# Patient Record
Sex: Female | Born: 1940 | Race: White | Hispanic: No | Marital: Married | Smoking: Never smoker
Health system: Southern US, Community
[De-identification: ages and names within clinical notes are randomized; demographics above are authoritative.]

## PROBLEM LIST (undated history)

## (undated) DIAGNOSIS — E039 Hypothyroidism, unspecified: Secondary | ICD-10-CM

## (undated) DIAGNOSIS — M1990 Unspecified osteoarthritis, unspecified site: Secondary | ICD-10-CM

## (undated) DIAGNOSIS — E079 Disorder of thyroid, unspecified: Secondary | ICD-10-CM

## (undated) DIAGNOSIS — I499 Cardiac arrhythmia, unspecified: Secondary | ICD-10-CM

## (undated) DIAGNOSIS — D0591 Unspecified type of carcinoma in situ of right breast: Secondary | ICD-10-CM

## (undated) DIAGNOSIS — Z923 Personal history of irradiation: Secondary | ICD-10-CM

## (undated) HISTORY — DX: Disorder of thyroid, unspecified: E07.9

## (undated) HISTORY — PX: BACK SURGERY: SHX140

## (undated) HISTORY — DX: Unspecified type of carcinoma in situ of right breast: D05.91

---

## 2005-04-04 ENCOUNTER — Ambulatory Visit: Payer: Self-pay | Admitting: Internal Medicine

## 2006-01-05 ENCOUNTER — Ambulatory Visit: Payer: Self-pay | Admitting: Internal Medicine

## 2006-01-13 ENCOUNTER — Ambulatory Visit: Payer: Self-pay | Admitting: Internal Medicine

## 2006-04-22 ENCOUNTER — Ambulatory Visit: Payer: Self-pay | Admitting: Internal Medicine

## 2007-06-01 ENCOUNTER — Ambulatory Visit: Payer: Self-pay | Admitting: Internal Medicine

## 2008-06-02 ENCOUNTER — Ambulatory Visit: Payer: Self-pay | Admitting: Internal Medicine

## 2009-06-14 ENCOUNTER — Ambulatory Visit: Payer: Self-pay | Admitting: Internal Medicine

## 2010-07-09 ENCOUNTER — Ambulatory Visit: Payer: Self-pay | Admitting: Internal Medicine

## 2011-01-22 ENCOUNTER — Encounter: Payer: Self-pay | Admitting: Internal Medicine

## 2011-07-21 ENCOUNTER — Ambulatory Visit: Payer: Self-pay | Admitting: Internal Medicine

## 2012-02-18 ENCOUNTER — Encounter: Payer: Self-pay | Admitting: Internal Medicine

## 2012-07-27 ENCOUNTER — Ambulatory Visit: Payer: Self-pay | Admitting: Internal Medicine

## 2013-08-08 ENCOUNTER — Ambulatory Visit: Payer: Self-pay | Admitting: Internal Medicine

## 2014-05-19 HISTORY — PX: COLONOSCOPY: SHX174

## 2014-08-11 ENCOUNTER — Ambulatory Visit: Payer: Self-pay | Admitting: Internal Medicine

## 2014-08-24 ENCOUNTER — Ambulatory Visit: Admit: 2014-08-24 | Disposition: A | Discharge status: Home / Self Care | Payer: Self-pay | Admitting: Gastroenterology

## 2014-09-11 LAB — SURGICAL PATHOLOGY

## 2015-05-20 HISTORY — PX: EYE SURGERY: SHX253

## 2015-08-16 ENCOUNTER — Other Ambulatory Visit: Payer: Self-pay | Admitting: Internal Medicine

## 2015-08-16 DIAGNOSIS — Z1231 Encounter for screening mammogram for malignant neoplasm of breast: Secondary | ICD-10-CM

## 2015-10-25 ENCOUNTER — Ambulatory Visit
Admission: RE | Admit: 2015-10-25 | Discharge: 2015-10-25 | Disposition: A | Discharge status: Home / Self Care | Payer: Medicare Other | Source: Ambulatory Visit | Attending: Internal Medicine | Admitting: Internal Medicine

## 2015-10-25 ENCOUNTER — Other Ambulatory Visit: Payer: Self-pay | Admitting: Internal Medicine

## 2015-10-25 DIAGNOSIS — Z1231 Encounter for screening mammogram for malignant neoplasm of breast: Secondary | ICD-10-CM | POA: Diagnosis present

## 2016-12-16 ENCOUNTER — Other Ambulatory Visit: Payer: Self-pay | Admitting: Internal Medicine

## 2016-12-16 DIAGNOSIS — Z1231 Encounter for screening mammogram for malignant neoplasm of breast: Secondary | ICD-10-CM

## 2017-01-01 ENCOUNTER — Ambulatory Visit
Admission: RE | Admit: 2017-01-01 | Discharge: 2017-01-01 | Disposition: A | Discharge status: Home / Self Care | Payer: Medicare Other | Source: Ambulatory Visit | Attending: Internal Medicine | Admitting: Internal Medicine

## 2017-01-01 DIAGNOSIS — Z1231 Encounter for screening mammogram for malignant neoplasm of breast: Secondary | ICD-10-CM | POA: Diagnosis not present

## 2017-01-05 ENCOUNTER — Other Ambulatory Visit: Payer: Self-pay | Admitting: Internal Medicine

## 2017-01-05 DIAGNOSIS — R928 Other abnormal and inconclusive findings on diagnostic imaging of breast: Secondary | ICD-10-CM

## 2017-01-05 DIAGNOSIS — R921 Mammographic calcification found on diagnostic imaging of breast: Secondary | ICD-10-CM

## 2017-01-05 DIAGNOSIS — N6489 Other specified disorders of breast: Secondary | ICD-10-CM

## 2017-01-16 ENCOUNTER — Ambulatory Visit
Admission: RE | Admit: 2017-01-16 | Discharge: 2017-01-16 | Disposition: A | Discharge status: Home / Self Care | Payer: Medicare Other | Source: Ambulatory Visit | Attending: Internal Medicine | Admitting: Internal Medicine

## 2017-01-16 DIAGNOSIS — N6489 Other specified disorders of breast: Secondary | ICD-10-CM | POA: Insufficient documentation

## 2017-01-16 DIAGNOSIS — R921 Mammographic calcification found on diagnostic imaging of breast: Secondary | ICD-10-CM

## 2017-01-16 DIAGNOSIS — R928 Other abnormal and inconclusive findings on diagnostic imaging of breast: Secondary | ICD-10-CM

## 2017-01-21 ENCOUNTER — Other Ambulatory Visit: Payer: Self-pay | Admitting: Internal Medicine

## 2017-01-21 DIAGNOSIS — R921 Mammographic calcification found on diagnostic imaging of breast: Secondary | ICD-10-CM

## 2017-01-21 DIAGNOSIS — R928 Other abnormal and inconclusive findings on diagnostic imaging of breast: Secondary | ICD-10-CM

## 2017-01-26 ENCOUNTER — Ambulatory Visit
Admission: RE | Admit: 2017-01-26 | Discharge: 2017-01-26 | Disposition: A | Discharge status: Home / Self Care | Payer: Medicare Other | Source: Ambulatory Visit | Attending: Internal Medicine | Admitting: Internal Medicine

## 2017-01-26 DIAGNOSIS — R928 Other abnormal and inconclusive findings on diagnostic imaging of breast: Secondary | ICD-10-CM

## 2017-01-26 DIAGNOSIS — D0511 Intraductal carcinoma in situ of right breast: Secondary | ICD-10-CM | POA: Insufficient documentation

## 2017-01-26 DIAGNOSIS — R921 Mammographic calcification found on diagnostic imaging of breast: Secondary | ICD-10-CM | POA: Insufficient documentation

## 2017-01-26 HISTORY — PX: BREAST BIOPSY: SHX20

## 2017-01-27 LAB — SURGICAL PATHOLOGY

## 2017-01-29 ENCOUNTER — Ambulatory Visit (INDEPENDENT_AMBULATORY_CARE_PROVIDER_SITE_OTHER): Payer: Medicare Other | Admitting: General Surgery

## 2017-01-29 ENCOUNTER — Encounter: Payer: Self-pay | Admitting: General Surgery

## 2017-01-29 ENCOUNTER — Inpatient Hospital Stay: Payer: Self-pay

## 2017-01-29 VITALS — BP 142/82 | HR 78 | Resp 12 | Ht 64.5 in | Wt 145.0 lb

## 2017-01-29 DIAGNOSIS — D0511 Intraductal carcinoma in situ of right breast: Secondary | ICD-10-CM | POA: Diagnosis not present

## 2017-01-29 DIAGNOSIS — N6311 Unspecified lump in the right breast, upper outer quadrant: Secondary | ICD-10-CM | POA: Diagnosis not present

## 2017-01-29 NOTE — Patient Instructions (Signed)
The patient is aware to call back for any questions or concerns.  
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
## 2017-01-30 DIAGNOSIS — D0511 Intraductal carcinoma in situ of right breast: Secondary | ICD-10-CM | POA: Insufficient documentation

## 2017-01-30 DIAGNOSIS — N6311 Unspecified lump in the right breast, upper outer quadrant: Secondary | ICD-10-CM | POA: Insufficient documentation

## 2017-02-02 ENCOUNTER — Ambulatory Visit (INDEPENDENT_AMBULATORY_CARE_PROVIDER_SITE_OTHER): Payer: Medicare Other | Admitting: General Surgery

## 2017-02-02 ENCOUNTER — Encounter: Payer: Self-pay | Admitting: General Surgery

## 2017-02-02 ENCOUNTER — Inpatient Hospital Stay: Payer: Self-pay

## 2017-02-02 VITALS — BP 140/72 | HR 68 | Resp 12 | Ht 64.0 in | Wt 147.0 lb

## 2017-02-02 DIAGNOSIS — N6311 Unspecified lump in the right breast, upper outer quadrant: Secondary | ICD-10-CM

## 2017-02-02 NOTE — Progress Notes (Signed)
Patient ID: Chanae Gemma, female   DOB: 1940-11-10, 76 y.o.   MRN: [id number]  Chief Complaint  Patient presents with  . Follow-up    HPI [name] is a 76 y.o. female here today for a right breast biopsy .  HPI  Past Medical History:  Diagnosis Date  . Thyroid disease     Past Surgical History:  Procedure Laterality Date  . 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 COLONOSCOPY  2016   Dr Candace Cruise    Family History  Problem Relation Age of Onset  . Breast cancer Maternal Aunt     Social History Social History  Substance Use Topics  . Smoking status: Never Smoker  . Smokeless tobacco: Never Used  . Alcohol use Yes     Comment: wine    No Known Allergies  Current Outpatient Prescriptions  Medication Sig Dispense Refill  . alendronate (FOSAMAX) 70 MG tablet TAKE 1 TAB BY MOUTH ONCE A WEEK. TAKE WITH A FULL GLASS OF WATER. DO NOT LIE DOWN FOR THE NEXT 30MIN    . Cholecalciferol (VITAMIN D3) 2000 units capsule Take 2,000 Units by mouth daily.     Marland Kitchen levothyroxine (SYNTHROID, LEVOTHROID) 112 MCG tablet Take 112 mcg by mouth daily before breakfast.      No current facility-administered medications for this visit.     Review of Systems Review of Systems  Constitutional: Negative.   Respiratory: Negative.   Cardiovascular: Negative.     Blood pressure 140/72, pulse 68, resp. rate 12, height 5\' 4"  (1.626 m), weight 147 lb (66.7 kg).  Physical Exam Physical Exam  Data Reviewed Ultrasound examination once again identified a hypoechoic mass in the upper-outer quadrant of the right breast 9 cm from the nipple with a maximum diameter of 1.37 cm. The area was prepped with alcohol and 10 mL of 0.5% Xylocaine with 0.25% Marcaine with 1-200,000 of epinephrine was utilized. This was well tolerated. ChloraPrep was applied to the skin. A 10-gauge Encor vacuum biopsy device was then advanced into the center of the  lesion from an LM approach and 6 core samples obtained with modest reduction in size. A postbiopsy clip was placed. Minimal bleeding noted. Skin defect closed with benzoin and Steri-Strips followed by Telfa and Tegaderm dressing. Ice pack applied. The procedure was well tolerated.  Assessment    Right breast mass, biopsy-proven DCIS retroareolar area.    Plan    The patient will be contacted when pathology is available arrangements made for review of options for management.       Robert Bellow 02/02/2017, 8:03 AM

## 2017-02-03 ENCOUNTER — Telehealth: Payer: Self-pay | Admitting: General Surgery

## 2017-02-03 NOTE — Telephone Encounter (Signed)
Patient notified of benign path. Tolerated procedure well.   Will work on scheduling for resection of the DCIS when the hematoma resolves.

## 2017-02-05 ENCOUNTER — Other Ambulatory Visit: Payer: Self-pay

## 2017-02-24 ENCOUNTER — Telehealth: Payer: Self-pay | Admitting: General Surgery

## 2017-02-24 NOTE — Telephone Encounter (Signed)
PATIENT CALLED IN & STATED SHE SAY DR Kerrin Mo TODAY & HE TOLD HER TO CALL AND MAKE AN APPOINTMENT HERE.ACCORDING TO YOUR NOTE WE MAY NEED TO SCHEDULE SURGERY AFTER AREA HAD HEALED UP.(Will work on scheduling for resection of the DCIS when the hematoma resolves.) PLEASE ADVISE. DOES SHE NEED AN OFFICE APPOINTMENT OR SURGERY SET UP?

## 2017-03-12 ENCOUNTER — Ambulatory Visit (INDEPENDENT_AMBULATORY_CARE_PROVIDER_SITE_OTHER): Payer: Medicare Other | Admitting: General Surgery

## 2017-03-12 ENCOUNTER — Encounter: Payer: Self-pay | Admitting: General Surgery

## 2017-03-12 ENCOUNTER — Inpatient Hospital Stay: Payer: Self-pay

## 2017-03-12 VITALS — BP 144/78 | HR 72 | Resp 14 | Ht 65.0 in | Wt 146.0 lb

## 2017-03-12 DIAGNOSIS — D0511 Intraductal carcinoma in situ of right breast: Secondary | ICD-10-CM | POA: Diagnosis not present

## 2017-03-12 NOTE — Progress Notes (Signed)
Patient ID: Janice Riley, female   DOB: Oct 30, 1940, 76 y.o.   MRN: [id number]  Chief Complaint  Patient presents with  . Follow-up    HPI Janice Riley is a 76 y.o. female.  Here for follow up hematoma post right breast biopsy for DCIS and discuss surgery. She feels like the areas is much better.   HPI  Past Medical History:  Diagnosis Date  . Thyroid disease     Past Surgical History:  Procedure Laterality Date  . 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 COLONOSCOPY  2016   Dr Candace Cruise    Family History  Problem Relation Age of Onset  . Breast cancer Maternal Aunt     Social History Social History  Substance Use Topics  . Smoking status: Never Smoker  . Smokeless tobacco: Never Used  . Alcohol use Yes     Comment: wine    No Known Allergies  Current Outpatient Prescriptions  Medication Sig Dispense Refill  . alendronate (FOSAMAX) 70 MG tablet TAKE 1 TAB BY MOUTH ONCE A WEEK. TAKE WITH A FULL GLASS OF WATER. DO NOT LIE DOWN FOR THE NEXT 30MIN    . Cholecalciferol (VITAMIN D3) 2000 units capsule Take 2,000 Units by mouth daily.     Marland Kitchen levothyroxine (SYNTHROID, LEVOTHROID) 112 MCG tablet Take 112 mcg by mouth daily before breakfast.      No current facility-administered medications for this visit.     Review of Systems Review of Systems  Constitutional: Negative.   Respiratory: Negative.   Cardiovascular: Negative.     Blood pressure (!) 144/78, pulse 72, resp. rate 14, height 5\' 5"  (1.651 m), weight 146 lb (66.2 kg), SpO2 95 %.  Physical Exam Physical Exam  Constitutional: She is oriented to person, place, and time. She appears well-developed and well-nourished.  HENT:  Mouth/Throat: Oropharynx is clear and moist.  Eyes: Conjunctivae are normal. No scleral icterus.  Neck: Neck supple.  Cardiovascular: Normal rate, regular rhythm and normal heart sounds.   Pulmonary/Chest: Effort normal and  breath sounds normal. Right breast exhibits no inverted nipple, no mass, no nipple discharge, no skin change and no tenderness. Left breast exhibits no inverted nipple, no mass, no nipple discharge, no skin change and no tenderness.    Hematoma much improved right breast  Lymphadenopathy:    She has no cervical adenopathy.    She has no axillary adenopathy.  Neurological: She is alert and oriented to person, place, and time.  Skin: Skin is warm and dry.  Psychiatric: Her behavior is normal.    Data Reviewed Ultrasound examination of the retroareolar area was utilized to assess options for management. There is a biopsy cavity now down to 0.85 cm in maximum diameter. In the anteromedial view there appears to be a prominent duct extending out of excellent 1.5 cm from the 11:00 towards the 12:00 position. The biopsy site is within a few millimeters of the overlying skin of the areola.  Assessment    High-grade DCIS, resolution of postbiopsy hematoma.    Plan    We spent a good bit of time reviewing options for management. The high-grade DCIS appears to come within a few millimeters of the overlying nipple areolar complex. The possibility of involvement of the structures was discussed. Options for management include either a) planned preservation of the nipple areolar complex with frozen section analysis of the superficial tissue proceeding to nipple areolar excision  if positive versus b) planned resection of the nipple areolar complex. Possibility of need for reexcision of other margins irregardless of attempts at nipple salvage were reviewed.  Role for postoperative radiation therapy discussed.  The patient's very concerned that surgical intervention and post surgery radiation at this time of year will be inconvenient. She raised a very legitimate question about what Angela Cox would assume if she postpone treatment until after the first of the year. He will with high-grade DCIS, I think there is  no statistical dated the suggest that she is Sims any additional risk by waiting 2 additional months. (We postponed a decision on surgical intervention for 6 weeks due to the postbiopsy hematoma). .     She will decide and call back, she may wait until January   HPI, Physical Exam, Assessment and Plan have been scribed under the direction and in the presence of Robert Bellow, MD. Karie Fetch, RN  I have completed the exam and reviewed the above documentation for accuracy and completeness.  I agree with the above.  Haematologist has been used and any errors in dictation or transcription are unintentional.  Hervey Ard, M.D., F.A.C.S.  Robert Bellow 03/12/2017, 9:06 PM

## 2017-03-12 NOTE — Patient Instructions (Signed)
The patient is aware to call back for any questions or concerns.  

## 2017-05-18 ENCOUNTER — Inpatient Hospital Stay: Payer: Self-pay

## 2017-05-18 ENCOUNTER — Ambulatory Visit: Payer: Medicare Other | Admitting: General Surgery

## 2017-05-18 ENCOUNTER — Encounter: Payer: Self-pay | Admitting: *Deleted

## 2017-05-18 VITALS — BP 128/68 | HR 82 | Resp 14 | Ht 65.0 in | Wt 146.0 lb

## 2017-05-18 DIAGNOSIS — D0511 Intraductal carcinoma in situ of right breast: Secondary | ICD-10-CM | POA: Diagnosis not present

## 2017-05-18 NOTE — Progress Notes (Signed)
Patient ID: Janice Riley, female   DOB: 1941-05-14, 76 y.o.   MRN: [id number]  Chief Complaint  Patient presents with  . Follow-up    HPI Janice Riley is a 76 y.o. female here today to discuss right breast surgery options.  The patient had originally been seen in September 2018 DCIS identified on radiology completed biopsy.  She wanted to defer surgical intervention until after the holidays.  At the time of our last visit we had a long discussion regarding the close proximity of the biopsy sites of the nipple/areolar complex and whether this should be resected or the area simply biopsy and depend on frozen section to confirm clear margins.  This fine point regarding the options for surgical intervention was brought up again today and reviewed in detail. HPI  Past Medical History:  Diagnosis Date  . Thyroid disease     Past Surgical History:  Procedure Laterality Date  . 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 COLONOSCOPY  2016   Dr Candace Cruise    Family History  Problem Relation Age of Onset  . Breast cancer Maternal Aunt     Social History Social History   Tobacco Use  . Smoking status: Never Smoker  . Smokeless tobacco: Never Used  Substance Use Topics  . Alcohol use: Yes    Comment: wine  . Drug use: No    No Known Allergies  Current Outpatient Medications  Medication Sig Dispense Refill  . alendronate (FOSAMAX) 70 MG tablet TAKE 1 TAB BY MOUTH ONCE A WEEK. TAKE WITH A FULL GLASS OF WATER. DO NOT LIE DOWN FOR THE NEXT 30MIN    . Cholecalciferol (VITAMIN D3) 2000 units capsule Take 2,000 Units by mouth daily.     Marland Kitchen levothyroxine (SYNTHROID, LEVOTHROID) 112 MCG tablet Take 112 mcg by mouth daily before breakfast.      No current facility-administered medications for this visit.     Review of Systems Review of Systems  Constitutional: Negative.   Respiratory: Negative.   Cardiovascular: Negative.      Blood pressure 128/68, pulse 82, resp. rate 14, height 5\' 5"  (1.651 m), weight 146 lb (66.2 kg).  Physical Exam Physical Exam  Constitutional: She is oriented to person, place, and time. She appears well-developed and well-nourished.  Eyes: Conjunctivae are normal. No scleral icterus.  Neck: Neck supple.  Cardiovascular: Normal rate, regular rhythm and normal heart sounds.  Pulmonary/Chest: Effort normal and breath sounds normal. Right breast exhibits no inverted nipple, no mass, no nipple discharge, no skin change and no tenderness. Left breast exhibits no inverted nipple, no mass, no nipple discharge, no skin change and no tenderness.    Lymphadenopathy:    She has no cervical adenopathy.    She has no axillary adenopathy.  Neurological: She is alert and oriented to person, place, and time.  Skin: Skin is warm and dry.    Data Reviewed Ultrasound examination today was completed to help determine whether preoperative wire localization would be required.  In the right breast there is residual scarring from her biopsy measuring 0.47 x 0.85 x 0.87 cm.  This is in the 9 o'clock position approximately 1.5 cm in the nipple, near the edge of the areola.  The distance from the central portion of the nipple to the central portion of the residual ultrasound abnormality is 1.43 cm.  The skin overlying the previous biopsy site appears undisturbed on ultrasound imaging.  BI-RADS-6.  Post biopsy mammogram of January 26, 2017 shows a string of calcium, likely within a duct, extending to the retroareolar area.  January 16, 2017 right breast diagnostic images: On today's additional diagnostic views with spot compression and 3D tomosynthesis, there is no persistent asymmetry within the outer right breast indicating superimposition of normal fibroglandular tissues.  Highly suspicious fine pleomorphic and fine linear branching calcifications within the slightly upper right breast, at  anterior depth, spanning 1.8 cm. Recommend stereotactic biopsy  #10, 2018 biopsy: Highly suspicious fine pleomorphic and fine linear branching calcifications within the slightly upper right breast, at anterior depth, spanning 1.8 cm. 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    Plan     Right breast excision @ [place]. The patient is aware to call back for any questions or concerns.  Plans at present are for resection of the area of concern and frozen section of the tissue adjacent to the nipple/areolar complex.  If positive for DCIS, or even if questionable by the patient's report, would plan for resection of the nipple/areolar complex.  If negative on frozen section, and positive on permanent section, the patient is aware that she may be recommended to have additional surgery.   HPI, Physical Exam, Assessment and Plan have been scribed under the direction and in the presence of Hervey Ard, MD.  Gaspar Cola, CMA  I have completed the exam and reviewed the above documentation for accuracy and completeness.  I agree with the above.  Haematologist has been used and any errors in dictation or transcription are unintentional.  Hervey Ard, M.D., F.A.C.S.  Robert Bellow 05/19/2017, 12:08 PM

## 2017-05-18 NOTE — Progress Notes (Unsigned)
Patient's surgery has been scheduled for 05-25-17.

## 2017-05-18 NOTE — Patient Instructions (Signed)
Right breast excision @ [facility]. The patient is aware to call back for any questions or concerns.

## 2017-05-19 DIAGNOSIS — I499 Cardiac arrhythmia, unspecified: Secondary | ICD-10-CM

## 2017-05-19 DIAGNOSIS — Z923 Personal history of irradiation: Secondary | ICD-10-CM

## 2017-05-19 HISTORY — DX: Personal history of irradiation: Z92.3

## 2017-05-19 HISTORY — DX: Cardiac arrhythmia, unspecified: I49.9

## 2017-05-22 ENCOUNTER — Other Ambulatory Visit: Payer: Self-pay

## 2017-05-22 ENCOUNTER — Encounter
Admission: RE | Admit: 2017-05-22 | Discharge: 2017-05-22 | Disposition: A | Discharge status: Home / Self Care | Payer: Medicare Other | Source: Ambulatory Visit | Attending: General Surgery | Admitting: General Surgery

## 2017-05-22 HISTORY — DX: Unspecified osteoarthritis, unspecified site: M19.90

## 2017-05-22 HISTORY — DX: Hypothyroidism, unspecified: E03.9

## 2017-05-22 NOTE — Patient Instructions (Signed)
Your procedure is scheduled on: 05-25-17 Report to Same Day Surgery 2nd floor medical mall [facility] Entrance-take elevator on left to 2nd floor.  Check in with surgery information desk.) To find out your arrival time please call [phone] between 1PM - 3PM on 05-22-17  Remember: Instructions that are not followed completely may result in serious medical risk, up to and including death, or upon the discretion of your surgeon and anesthesiologist your surgery may need to be rescheduled.    _x___ 1. Do not eat food after midnight the night before your procedure. NO GUM OR CANDY AFTER MIDNIGHT.  You may drink clear liquids up to 2 hours before you are scheduled to arrive at the hospital for your procedure.  Do not drink clear liquids within 2 hours of your scheduled arrival to the hospital.  Clear liquids include  --Water or Apple juice without pulp  --Clear carbohydrate beverage such as ClearFast or Gatorade  --Black Coffee or Clear Tea (No milk, no creamers, do not add anything to the coffee or Tea      __x__ 2. No Alcohol for 24 hours before or after surgery.   __x__3. No Smoking for 24 prior to surgery.   ____  4. Bring all medications with you on the day of surgery if instructed.    __x__ 5. Notify your doctor if there is any change in your medical condition     (cold, fever, infections).     Do not wear jewelry, make-up, hairpins, clips or nail polish.  Do not wear lotions, powders, or perfumes. You may wear deodorant.  Do not shave 48 hours prior to surgery. Men may shave face and neck.  Do not bring valuables to the hospital.    [facility] is not responsible for any belongings or valuables.               Contacts, dentures or bridgework may not be worn into surgery.  Leave your suitcase in the car. After surgery it may be brought to your room.  For patients admitted to the hospital, discharge time is determined by your treatment team.   Patients discharged the day of  surgery will not be allowed to drive home.  You will need someone to drive you home and stay with you the night of your procedure.    Please read over the following fact sheets that you were given:   [facility] Preparing for Surgery and or MRSA Information   _x___ TAKE THE FOLLOWING MEDICATION THE MORNING OF SURGERY WITH A SMALL SIP OF WATER. These include:  1. LEVOTHYROXINE  2.  3.  4.  5.  6.  ____Fleets enema or Magnesium Citrate as directed.   _x___ Use CHG Soap or sage wipes as directed on instruction sheet   ____ Use inhalers on the day of surgery and bring to hospital day of surgery  ____ Stop Metformin and Janumet 2 days prior to surgery.    ____ Take 1/2 of usual insulin dose the night before surgery and none on the morning surgery.   ____ Follow recommendations from Cardiologist, Pulmonologist or PCP regarding stopping Aspirin, Coumadin, Plavix ,Eliquis, Effient, or Pradaxa, and Pletal.  ____Stop Anti-inflammatories such as Advil, Aleve, Ibuprofen, Motrin, Naproxen, Naprosyn, Goodies powders or aspirin products. OK to take Tylenol    ____ Stop supplements until after surgery.  But may continue Vitamin D, Vitamin B,       and multivitamin.   ____ Bring C-Pap to the hospital.

## 2017-05-25 ENCOUNTER — Telehealth: Payer: Self-pay | Admitting: *Deleted

## 2017-05-25 ENCOUNTER — Other Ambulatory Visit: Payer: Self-pay

## 2017-05-25 ENCOUNTER — Encounter: Payer: Self-pay | Admitting: *Deleted

## 2017-05-25 ENCOUNTER — Ambulatory Visit
Admission: RE | Admit: 2017-05-25 | Discharge: 2017-05-25 | Disposition: A | Discharge status: Home / Self Care | Payer: Medicare Other | Source: Ambulatory Visit | Attending: General Surgery | Admitting: General Surgery

## 2017-05-25 ENCOUNTER — Ambulatory Visit: Payer: Medicare Other | Admitting: Anesthesiology

## 2017-05-25 ENCOUNTER — Encounter
Admission: RE | Disposition: A | Discharge status: Home / Self Care | Payer: Self-pay | Source: Ambulatory Visit | Attending: General Surgery

## 2017-05-25 DIAGNOSIS — I452 Bifascicular block: Secondary | ICD-10-CM | POA: Insufficient documentation

## 2017-05-25 DIAGNOSIS — C50911 Malignant neoplasm of unspecified site of right female breast: Secondary | ICD-10-CM | POA: Insufficient documentation

## 2017-05-25 DIAGNOSIS — Z538 Procedure and treatment not carried out for other reasons: Secondary | ICD-10-CM | POA: Diagnosis not present

## 2017-05-25 SURGERY — MASTECTOMY PARTIAL
Anesthesia: General | Laterality: Right

## 2017-05-25 MED ORDER — LACTATED RINGERS IV SOLN
INTRAVENOUS | Status: DC
  Administered 2017-05-25: 07:00:00 via INTRAVENOUS

## 2017-05-25 MED ORDER — PHENYLEPHRINE HCL 10 MG/ML IJ SOLN
INTRAMUSCULAR | Status: AC
  Filled 2017-05-25: qty 1

## 2017-05-25 MED ORDER — FAMOTIDINE 20 MG PO TABS
ORAL_TABLET | ORAL | Status: AC
  Administered 2017-05-25: 20 mg via ORAL
  Filled 2017-05-25: qty 1

## 2017-05-25 MED ORDER — LIDOCAINE HCL (PF) 2 % IJ SOLN
INTRAMUSCULAR | Status: AC
  Filled 2017-05-25: qty 10

## 2017-05-25 MED ORDER — MIDAZOLAM HCL 2 MG/2ML IJ SOLN
INTRAMUSCULAR | Status: AC
  Filled 2017-05-25: qty 2

## 2017-05-25 MED ORDER — DEXAMETHASONE SODIUM PHOSPHATE 10 MG/ML IJ SOLN
INTRAMUSCULAR | Status: AC
  Filled 2017-05-25: qty 1

## 2017-05-25 MED ORDER — PROPOFOL 10 MG/ML IV BOLUS
INTRAVENOUS | Status: AC
  Filled 2017-05-25: qty 20

## 2017-05-25 MED ORDER — SEVOFLURANE IN SOLN
RESPIRATORY_TRACT | Status: AC
  Filled 2017-05-25: qty 250

## 2017-05-25 MED ORDER — FENTANYL CITRATE (PF) 100 MCG/2ML IJ SOLN
INTRAMUSCULAR | Status: AC
  Filled 2017-05-25: qty 2

## 2017-05-25 MED ORDER — ONDANSETRON HCL 4 MG/2ML IJ SOLN
INTRAMUSCULAR | Status: AC
  Filled 2017-05-25: qty 2

## 2017-05-25 MED ORDER — FAMOTIDINE 20 MG PO TABS
20.0000 mg | ORAL_TABLET | Freq: Once | ORAL | Status: AC
  Administered 2017-05-25: 20 mg via ORAL

## 2017-05-25 MED ORDER — GLYCOPYRROLATE 0.2 MG/ML IJ SOLN
INTRAMUSCULAR | Status: AC
  Filled 2017-05-25: qty 1

## 2017-05-25 MED ORDER — EPHEDRINE SULFATE 50 MG/ML IJ SOLN
INTRAMUSCULAR | Status: AC
  Filled 2017-05-25: qty 1

## 2017-05-25 MED ORDER — BUPIVACAINE-EPINEPHRINE (PF) 0.5% -1:200000 IJ SOLN
INTRAMUSCULAR | Status: AC
  Filled 2017-05-25: qty 30

## 2017-05-25 MED ORDER — SUCCINYLCHOLINE CHLORIDE 20 MG/ML IJ SOLN
INTRAMUSCULAR | Status: AC
  Filled 2017-05-25: qty 1

## 2017-05-25 SURGICAL SUPPLY — 53 items
BANDAGE ELASTIC 6 LF NS (GAUZE/BANDAGES/DRESSINGS) ×2 IMPLANT
BLADE SURG 15 STRL SS SAFETY (BLADE) ×4 IMPLANT
BNDG CMPR MED 5X6 ELC HKLP NS (GAUZE/BANDAGES/DRESSINGS) ×1
BNDG GAUZE 4.5X4.1 6PLY STRL (MISCELLANEOUS) ×2 IMPLANT
BULB RESERV EVAC DRAIN JP 100C (MISCELLANEOUS) IMPLANT
CANISTER SUCT 1200ML W/VALVE (MISCELLANEOUS) ×2 IMPLANT
CHLORAPREP W/TINT 26ML (MISCELLANEOUS) ×2 IMPLANT
CNTNR SPEC 2.5X3XGRAD LEK (MISCELLANEOUS) ×1
CONT SPEC 4OZ STER OR WHT (MISCELLANEOUS) ×1
CONT SPEC 4OZ STRL OR WHT (MISCELLANEOUS) ×1
CONTAINER SPEC 2.5X3XGRAD LEK (MISCELLANEOUS) ×1 IMPLANT
COVER PROBE FLX POLY STRL (MISCELLANEOUS) ×2 IMPLANT
DEVICE DUBIN SPECIMEN MAMMOGRA (MISCELLANEOUS) ×2 IMPLANT
DRAIN CHANNEL JP 15F RND 16 (MISCELLANEOUS) IMPLANT
DRAPE LAPAROTOMY 100X77 ABD (DRAPES) ×2 IMPLANT
DRAPE LAPAROTOMY TRNSV 106X77 (MISCELLANEOUS) ×2 IMPLANT
DRSG TELFA 3X8 NADH (GAUZE/BANDAGES/DRESSINGS) ×2 IMPLANT
DRSG TELFA 4X3 1S NADH ST (GAUZE/BANDAGES/DRESSINGS) ×4 IMPLANT
ELECT CAUTERY BLADE TIP 2.5 (TIP) ×2
ELECT REM PT RETURN 9FT ADLT (ELECTROSURGICAL) ×2
ELECTRODE CAUTERY BLDE TIP 2.5 (TIP) ×1 IMPLANT
ELECTRODE REM PT RTRN 9FT ADLT (ELECTROSURGICAL) ×1 IMPLANT
GAUZE FLUFF 18X24 1PLY STRL (GAUZE/BANDAGES/DRESSINGS) ×2 IMPLANT
GAUZE SPONGE 4X4 12PLY STRL (GAUZE/BANDAGES/DRESSINGS) ×2 IMPLANT
GLOVE BIO SURGEON STRL SZ7.5 (GLOVE) ×2 IMPLANT
GLOVE INDICATOR 8.0 STRL GRN (GLOVE) ×2 IMPLANT
GOWN STRL REUS W/ TWL LRG LVL3 (GOWN DISPOSABLE) ×2 IMPLANT
GOWN STRL REUS W/TWL LRG LVL3 (GOWN DISPOSABLE) ×4
KIT RM TURNOVER STRD PROC AR (KITS) ×2 IMPLANT
LABEL OR SOLS (LABEL) ×2 IMPLANT
MARGIN MAP 10MM (MISCELLANEOUS) ×2 IMPLANT
NDL HYPO 25X1 1.5 SAFETY (NEEDLE) ×2 IMPLANT
NEEDLE HYPO 22GX1.5 SAFETY (NEEDLE) ×2 IMPLANT
NEEDLE HYPO 25X1 1.5 SAFETY (NEEDLE) ×4 IMPLANT
PACK BASIN MINOR ARMC (MISCELLANEOUS) ×2 IMPLANT
PAD DRESSING TELFA 3X8 NADH (GAUZE/BANDAGES/DRESSINGS) ×1 IMPLANT
SHEARS FOC LG CVD HARMONIC 17C (MISCELLANEOUS) IMPLANT
SHEARS HARMONIC 9CM CVD (BLADE) ×2 IMPLANT
STRIP CLOSURE SKIN 1/2X4 (GAUZE/BANDAGES/DRESSINGS) ×2 IMPLANT
SUT ETHILON 3-0 FS-10 30 BLK (SUTURE) ×2
SUT SILK 2 0 (SUTURE) ×2
SUT SILK 2-0 18XBRD TIE 12 (SUTURE) ×1 IMPLANT
SUT VIC AB 2-0 CT1 27 (SUTURE) ×2
SUT VIC AB 2-0 CT1 TAPERPNT 27 (SUTURE) ×1 IMPLANT
SUT VIC AB 4-0 FS2 27 (SUTURE) ×4 IMPLANT
SUT VICRYL+ 3-0 144IN (SUTURE) ×2 IMPLANT
SUTURE EHLN 3-0 FS-10 30 BLK (SUTURE) ×1 IMPLANT
SWABSTK COMLB BENZOIN TINCTURE (MISCELLANEOUS) ×2 IMPLANT
SYR 10ML LL (SYRINGE) ×2 IMPLANT
SYR BULB IRRIG 60ML STRL (SYRINGE) ×2 IMPLANT
SYR CONTROL 10ML (SYRINGE) ×2 IMPLANT
TAPE TRANSPORE STRL 2 31045 (GAUZE/BANDAGES/DRESSINGS) ×2 IMPLANT
WATER STERILE IRR 1000ML POUR (IV SOLUTION) ×2 IMPLANT

## 2017-05-25 NOTE — Telephone Encounter (Signed)
Patient called to say that she is going to see [facility] tomorrow 05/26/17 at 10:30am

## 2017-05-25 NOTE — Care Management (Signed)
I was told this pt waqs for C-section. Wrong pt.

## 2017-05-25 NOTE — H&P (Signed)
ECG shows bifascicular block. Cardio wants to see as an outpatient.  Surgery cancelled.

## 2017-05-25 NOTE — Anesthesia Preprocedure Evaluation (Addendum)
Anesthesia Evaluation  Patient identified by MRN, date of birth, ID band Patient awake    Reviewed: Allergy & Precautions, NPO status , Patient's Chart, lab work & pertinent test results, reviewed documented beta blocker date and time   Airway Mallampati: II  TM Distance: >3 FB     Dental  (+) Chipped, Upper Dentures, Lower Dentures   Pulmonary           Cardiovascular      Neuro/Psych    GI/Hepatic   Endo/Other  Hypothyroidism   Renal/GU      Musculoskeletal  (+) Arthritis ,   Abdominal   Peds  Hematology   Anesthesia Other Findings   Reproductive/Obstetrics                            Anesthesia Physical Anesthesia Plan  ASA: III  Anesthesia Plan: General   Post-op Pain Management:    Induction: Intravenous  PONV Risk Score and Plan:   Airway Management Planned: LMA  Additional Equipment:   Intra-op Plan:   Post-operative Plan:   Informed Consent: I have reviewed the patients History and Physical, chart, labs and discussed the procedure including the risks, benefits and alternatives for the proposed anesthesia with the patient or authorized representative who has indicated his/her understanding and acceptance.     Plan Discussed with: CRNA  Anesthesia Plan Comments:         Anesthesia Quick Evaluation

## 2017-06-09 ENCOUNTER — Telehealth: Payer: Self-pay | Admitting: *Deleted

## 2017-06-09 NOTE — Telephone Encounter (Signed)
PATIENT CAME IN TODAY AND STATES SHE HAS SEEN DR Nehemiah Massed & HAS HAD THE STRESS TEST & ECHOCARDIOGRAM COMPLETED 06-02-2017.PER THE PATIENT ALL SHOULD BE IN THE SYSTEM.

## 2017-06-09 NOTE — Telephone Encounter (Signed)
Surgery cancelled 05/25/17 because of abnormal ECG. Saw Dr> Nehemiah Massed, stress test planned. See if that has been done. Thanks.      Her appointment was Thursday, she had a stress test echocardiogram. Dr Nehemiah Massed stated everything looked great. See Epic note. She will need new surgery dates.

## 2017-06-09 NOTE — Telephone Encounter (Signed)
-----   Message from Robert Bellow, MD sent at 06/09/2017  8:02 AM EST ----- Surgery cancelled 05/25/17 because of abnormal ECG. Saw Dr> Nehemiah Massed, stress test planned. See if that has been done. Thanks.

## 2017-06-10 ENCOUNTER — Telehealth: Payer: Self-pay | Admitting: *Deleted

## 2017-06-10 ENCOUNTER — Other Ambulatory Visit: Payer: Self-pay | Admitting: General Surgery

## 2017-06-10 DIAGNOSIS — D0511 Intraductal carcinoma in situ of right breast: Secondary | ICD-10-CM

## 2017-06-10 NOTE — Progress Notes (Signed)
Cardiac evaluation has been completed by Serafina Royals, MD.  Patient is cleared for anesthesia.  Plans at present are for resection of the area of concern and frozen section of the tissue adjacent to the nipple/areolar complex.  If positive for DCIS, or even if questionable by the patient's report, would plan for resection of the nipple/areolar complex.  If negative on frozen section, and positive on permanent section, the patient is aware that she may be recommended to have additional surgery.

## 2017-06-10 NOTE — Telephone Encounter (Signed)
Patient called the office back and wishes to schedule surgery for 07-06-17 at [facility].   The patient was offered 06-22-17 but declined due to other things she has going on at that time.   Patient instructed to call the office if she has further questions. She verbalizes understanding.

## 2017-06-10 NOTE — Telephone Encounter (Signed)
Message left on home and cell phone numbers for patient to call the office.   Patient has received cardiac clearance from Dr. Serafina Royals and we can now reschedule surgery at patient's convenience.

## 2017-06-19 HISTORY — PX: BREAST LUMPECTOMY: SHX2

## 2017-06-25 ENCOUNTER — Encounter: Payer: Self-pay | Admitting: General Surgery

## 2017-06-25 ENCOUNTER — Inpatient Hospital Stay (INDEPENDENT_AMBULATORY_CARE_PROVIDER_SITE_OTHER): Payer: Medicare Other

## 2017-06-25 ENCOUNTER — Ambulatory Visit: Payer: Medicare Other | Admitting: General Surgery

## 2017-06-25 VITALS — BP 124/68 | HR 72 | Resp 16 | Ht 65.0 in | Wt 149.0 lb

## 2017-06-25 DIAGNOSIS — D0511 Intraductal carcinoma in situ of right breast: Secondary | ICD-10-CM

## 2017-06-25 NOTE — Patient Instructions (Addendum)
The patient is aware to call back for any questions or new concerns.  

## 2017-06-25 NOTE — Progress Notes (Signed)
Patient ID: Janice Riley, female   DOB: [birth year], 77 y.o.   MRN: [id number]  No chief complaint on file.   HPI Janice Riley is a 77 y.o. female.  Here today for pre op right breast wide excision for 07-06-17. Cardiology consult with Dr Nehemiah Massed cleared for surgery.  HPI  Past Medical History:  Diagnosis Date  . Arthritis    FINGERS  . Hypothyroidism   . Thyroid disease     Past Surgical History:  Procedure Laterality Date  . 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 COLONOSCOPY  2016   Dr Candace Cruise    Family History  Problem Relation Age of Onset  . Breast cancer Maternal Aunt     Social History Social History   Tobacco Use  . Smoking status: Never Smoker  . Smokeless tobacco: Never Used  Substance Use Topics  . Alcohol use: Yes    Comment: wine   . Drug use: No    No Known Allergies  Current Outpatient Medications  Medication Sig Dispense Refill  . alendronate (FOSAMAX) 70 MG tablet Take 70 mg by mouth once weekly on Wednesday    . Cholecalciferol (VITAMIN D3) 2000 units capsule Take 2,000 Units by mouth daily.     Marland Kitchen levothyroxine (SYNTHROID, LEVOTHROID) 112 MCG tablet Take 112 mcg by mouth daily before breakfast.      No current facility-administered medications for this visit.     Review of Systems Review of Systems  Constitutional: Negative.   Respiratory: Negative.   Cardiovascular: Negative.     Blood pressure 124/68, pulse 72, resp. rate 16, height 5\' 5"  (1.651 m), weight 149 lb (67.6 kg).  Physical Exam Physical Exam  Constitutional: She is oriented to person, place, and time. She appears well-developed and well-nourished.  Eyes: Pupils are equal, round, and reactive to light.  Neck: Neck supple. No thyromegaly present.  Cardiovascular: Normal rate and regular rhythm.  Pulmonary/Chest: Effort normal and breath sounds normal.  Neurological: She is alert and oriented to person,  place, and time.  Skin: Skin is warm and dry.  Psychiatric: Her behavior is normal.    Data Reviewed Ultrasound examination of the right breast in the 9 o'clock position, 2 cm from the nipple shows a 0.75 x 0.82 densely shadowing area consistent with the previous biopsy cavity.  BI-RADS-6.  June 04, 2017 cardiology evaluation with Serafina Royals, MD: INTERPRETATION NORMAL LEFT VENTRICULAR SYSTOLIC FUNCTION WITH MILD LVH NORMAL RIGHT VENTRICULAR SYSTOLIC FUNCTION MILD VALVULAR REGURGITATION (See above) NO VALVULAR STENOSIS MILD MR, TR EF 60%     Assessment    DCIS involving the right breast.    Plan    Planned surgery reviewed.  Summary from the May 18, 2017 exam as noted below: Plans at present are for resection of the area of concern and frozen section of the tissue adjacent to the nipple/areolar complex.  If positive for DCIS, or even if questionable by the patient's report, would plan for resection of the nipple/areolar complex.  If negative on frozen section, and positive on permanent section, the patient is aware that she may be recommended to have additional surgery.        HPI, Physical Exam, Assessment and Plan have been scribed under the direction and in the presence of Robert Bellow, MD. Karie Fetch, RN   Karie Fetch M 06/25/2017, 10:46 AM

## 2017-06-26 ENCOUNTER — Encounter
Admission: RE | Admit: 2017-06-26 | Discharge: 2017-06-26 | Disposition: A | Discharge status: Home / Self Care | Payer: Medicare Other | Source: Ambulatory Visit | Attending: General Surgery | Admitting: General Surgery

## 2017-06-26 ENCOUNTER — Other Ambulatory Visit: Payer: Self-pay

## 2017-06-26 HISTORY — DX: Cardiac arrhythmia, unspecified: I49.9

## 2017-06-26 NOTE — Patient Instructions (Signed)
Your procedure is scheduled on: Monday, February 18th  To find out your arrival time please call [phone]                    between 1PM - 3PM on Friday, February 15th  Report to [facility], 2ND FLOOR   Remember: Instructions that are not followed completely may result in serious medical risk,  up to and including death, or upon the discretion of your surgeon  and anesthesiologist your surgery may need to be rescheduled.     _X__ 1. Do not eat food after midnight the night before your procedure.                 No gum chewing, lozengers or hard candies.                  You may drink clear liquids up to 2 hours                 before you are scheduled to arrive for your surgery-                  Clear Liquids include:  water, apple juice without pulp, clear carbohydrate                 drink such as Clearfast of Gartorade, Black Coffee or Tea (Do not add                 anything to coffee or tea).  __X__2.  On the morning of surgery brush your teeth with toothpaste and water,                               you may rinse your mouth with mouthwash if you wish.                                        Do not swallow any toothpaste of mouthwash.     _X__ 3.  No Alcohol for 24 hours before or after surgery.   _X__ 4.  Do Not Smoke or use e-cigarettes For 24 Hours Prior to Your Surgery.                 Do not use any chewable tobacco products for at least 6 hours prior to                 surgery.  ____  5.  Bring all medications with you on the day of surgery if instructed.   ____  6.  Notify your doctor if there is any change in your medical condition      (cold, fever, infections).     Do not wear jewelry, make-up, hairpins, clips or nail polish. Do not wear lotions, powders, or perfumes. You may wear deodorant. Do not shave 48 hours prior to surgery. Men may shave face and neck. Do not bring valuables to the hospital.    [facility] is not  responsible for any belongings or valuables.  Contacts, dentures or bridgework may not be worn into surgery. Leave your suitcase in the car. After surgery it may be brought to your room. For patients admitted to the hospital, discharge time is determined by your treatment team.   Patients discharged the day of surgery will not be allowed to drive  home.   Please read over the following fact sheets that you were given:                                         [place] PHONE   ____ Take these medicines the morning of surgery with A SIP OF WATER:    1. SYNTHROID  2.   3.   4.  5.  6.  ____ Fleet Enema (as directed)   _X___ Use ANTIBACTERIAL  Soap as directed  ____ Use inhalers on the day of surgery  _X___ Stop ALL ASPIRIN PRODUCTS ONE WEEK BEFORE SURGERY  _X___ Stop Anti-inflammatories AS OF ONE WEEK BEFORE SURGERY                 THIS INCLUDES IBUPROFEN / MOTRIN / ADVIL / ALEVE /                              NAPROSYN / GOODYS POWDER   ____ Stop supplements until after surgery.                      YOU MAY CONTINUE YOUR VITAMIN D3 UP UNTIL                        DAY OF SURGERY.  DO NOT TAKE ON SURGERY DAY  ____ Bring C-Pap to the hospital.   HAVE STOOL SOFTENERS FOR USE WHEN YOU RETURN HOME.  WEAR LOOSE FITTING SHIRT FOR AFTER SURGERY  BRING A SPORTS BRA THAT OPENS IN THE FRONT TO                WEAR ONCE HOME 24 HOURS A DAY.

## 2017-07-06 ENCOUNTER — Ambulatory Visit
Admission: RE | Admit: 2017-07-06 | Discharge: 2017-07-06 | Disposition: A | Discharge status: Home / Self Care | Payer: Medicare Other | Source: Ambulatory Visit | Attending: General Surgery | Admitting: General Surgery

## 2017-07-06 ENCOUNTER — Ambulatory Visit: Payer: Medicare Other | Admitting: Anesthesiology

## 2017-07-06 ENCOUNTER — Encounter: Payer: Self-pay | Admitting: Emergency Medicine

## 2017-07-06 ENCOUNTER — Encounter
Admission: RE | Disposition: A | Discharge status: Home / Self Care | Payer: Self-pay | Source: Ambulatory Visit | Attending: General Surgery

## 2017-07-06 ENCOUNTER — Ambulatory Visit: Payer: Medicare Other

## 2017-07-06 DIAGNOSIS — M19049 Primary osteoarthritis, unspecified hand: Secondary | ICD-10-CM | POA: Diagnosis not present

## 2017-07-06 DIAGNOSIS — E039 Hypothyroidism, unspecified: Secondary | ICD-10-CM | POA: Insufficient documentation

## 2017-07-06 DIAGNOSIS — D0591 Unspecified type of carcinoma in situ of right breast: Secondary | ICD-10-CM

## 2017-07-06 DIAGNOSIS — D0511 Intraductal carcinoma in situ of right breast: Secondary | ICD-10-CM | POA: Insufficient documentation

## 2017-07-06 DIAGNOSIS — Z79899 Other long term (current) drug therapy: Secondary | ICD-10-CM | POA: Insufficient documentation

## 2017-07-06 DIAGNOSIS — N641 Fat necrosis of breast: Secondary | ICD-10-CM | POA: Diagnosis not present

## 2017-07-06 DIAGNOSIS — C50411 Malignant neoplasm of upper-outer quadrant of right female breast: Secondary | ICD-10-CM | POA: Diagnosis not present

## 2017-07-06 DIAGNOSIS — N6310 Unspecified lump in the right breast, unspecified quadrant: Secondary | ICD-10-CM

## 2017-07-06 HISTORY — PX: MASTECTOMY, PARTIAL: SHX709

## 2017-07-06 HISTORY — DX: Unspecified type of carcinoma in situ of right breast: D05.91

## 2017-07-06 HISTORY — PX: BREAST RECONSTRUCTION WITH MASTOPLASTY: SHX6752

## 2017-07-06 SURGERY — MASTECTOMY PARTIAL
Anesthesia: General | Laterality: Right | Wound class: Clean

## 2017-07-06 MED ORDER — GABAPENTIN 300 MG PO CAPS
ORAL_CAPSULE | ORAL | Status: AC
  Administered 2017-07-06: 300 mg via ORAL
  Filled 2017-07-06: qty 1

## 2017-07-06 MED ORDER — BUPIVACAINE HCL (PF) 0.5 % IJ SOLN
INTRAMUSCULAR | Status: AC
  Filled 2017-07-06: qty 30

## 2017-07-06 MED ORDER — FENTANYL CITRATE (PF) 100 MCG/2ML IJ SOLN: 25.0000 ug | INTRAMUSCULAR | Status: DC | PRN

## 2017-07-06 MED ORDER — PROPOFOL 10 MG/ML IV BOLUS
INTRAVENOUS | Status: DC | PRN
  Administered 2017-07-06: 20 mg via INTRAVENOUS
  Administered 2017-07-06: 30 mg via INTRAVENOUS
  Administered 2017-07-06: 150 mg via INTRAVENOUS

## 2017-07-06 MED ORDER — MIDAZOLAM HCL 2 MG/2ML IJ SOLN
INTRAMUSCULAR | Status: AC
  Filled 2017-07-06: qty 2

## 2017-07-06 MED ORDER — CELECOXIB 200 MG PO CAPS
ORAL_CAPSULE | ORAL | Status: AC
  Administered 2017-07-06: 200 mg via ORAL
  Filled 2017-07-06: qty 1

## 2017-07-06 MED ORDER — ACETAMINOPHEN 10 MG/ML IV SOLN
INTRAVENOUS | Status: DC | PRN
  Administered 2017-07-06: 1000 mg via INTRAVENOUS

## 2017-07-06 MED ORDER — FENTANYL CITRATE (PF) 100 MCG/2ML IJ SOLN
INTRAMUSCULAR | Status: DC | PRN
  Administered 2017-07-06: 25 ug via INTRAVENOUS
  Administered 2017-07-06: 50 ug via INTRAVENOUS
  Administered 2017-07-06: 25 ug via INTRAVENOUS

## 2017-07-06 MED ORDER — FAMOTIDINE 20 MG PO TABS
ORAL_TABLET | ORAL | Status: AC
  Administered 2017-07-06: 20 mg via ORAL
  Filled 2017-07-06: qty 1

## 2017-07-06 MED ORDER — FENTANYL CITRATE (PF) 100 MCG/2ML IJ SOLN
INTRAMUSCULAR | Status: AC
  Filled 2017-07-06: qty 2

## 2017-07-06 MED ORDER — LIDOCAINE HCL (PF) 2 % IJ SOLN
INTRAMUSCULAR | Status: AC
  Filled 2017-07-06: qty 10

## 2017-07-06 MED ORDER — ONDANSETRON HCL 4 MG/2ML IJ SOLN: 4.0000 mg | Freq: Once | INTRAMUSCULAR | Status: DC | PRN

## 2017-07-06 MED ORDER — GABAPENTIN 300 MG PO CAPS: 300.0000 mg | ORAL_CAPSULE | ORAL | Status: DC

## 2017-07-06 MED ORDER — EPHEDRINE SULFATE 50 MG/ML IJ SOLN
INTRAMUSCULAR | Status: DC | PRN
  Administered 2017-07-06 (×3): 5 mg via INTRAVENOUS

## 2017-07-06 MED ORDER — GABAPENTIN 300 MG PO CAPS
300.0000 mg | ORAL_CAPSULE | ORAL | Status: AC
  Administered 2017-07-06: 300 mg via ORAL

## 2017-07-06 MED ORDER — MIDAZOLAM HCL 2 MG/2ML IJ SOLN
INTRAMUSCULAR | Status: DC | PRN
  Administered 2017-07-06: 1 mg via INTRAVENOUS

## 2017-07-06 MED ORDER — ONDANSETRON HCL 4 MG/2ML IJ SOLN
INTRAMUSCULAR | Status: AC
  Filled 2017-07-06: qty 2

## 2017-07-06 MED ORDER — FAMOTIDINE 20 MG PO TABS
20.0000 mg | ORAL_TABLET | Freq: Once | ORAL | Status: AC
  Administered 2017-07-06: 20 mg via ORAL

## 2017-07-06 MED ORDER — PROPOFOL 10 MG/ML IV BOLUS
INTRAVENOUS | Status: AC
  Filled 2017-07-06: qty 20

## 2017-07-06 MED ORDER — DEXAMETHASONE SODIUM PHOSPHATE 10 MG/ML IJ SOLN
INTRAMUSCULAR | Status: DC | PRN
  Administered 2017-07-06: 5 mg via INTRAVENOUS

## 2017-07-06 MED ORDER — HYDROCODONE-ACETAMINOPHEN 5-325 MG PO TABS: 1.0000 | ORAL_TABLET | ORAL | 0 refills | Status: DC | PRN

## 2017-07-06 MED ORDER — DEXAMETHASONE SODIUM PHOSPHATE 10 MG/ML IJ SOLN
INTRAMUSCULAR | Status: AC
  Filled 2017-07-06: qty 1

## 2017-07-06 MED ORDER — BUPIVACAINE HCL 0.5 % IJ SOLN
INTRAMUSCULAR | Status: DC | PRN
  Administered 2017-07-06: 30 mL

## 2017-07-06 MED ORDER — BUPIVACAINE-EPINEPHRINE (PF) 0.5% -1:200000 IJ SOLN
INTRAMUSCULAR | Status: AC
  Filled 2017-07-06: qty 30

## 2017-07-06 MED ORDER — CELECOXIB 200 MG PO CAPS
200.0000 mg | ORAL_CAPSULE | ORAL | Status: AC
  Administered 2017-07-06: 200 mg via ORAL

## 2017-07-06 MED ORDER — ONDANSETRON HCL 4 MG/2ML IJ SOLN
INTRAMUSCULAR | Status: DC | PRN
  Administered 2017-07-06: 4 mg via INTRAVENOUS

## 2017-07-06 MED ORDER — HYDROCODONE-ACETAMINOPHEN 5-325 MG PO TABS
1.0000 | ORAL_TABLET | ORAL | Status: DC | PRN
  Administered 2017-07-06: 1 via ORAL

## 2017-07-06 MED ORDER — LIDOCAINE HCL (CARDIAC) 20 MG/ML IV SOLN
INTRAVENOUS | Status: DC | PRN
  Administered 2017-07-06: 100 mg via INTRAVENOUS

## 2017-07-06 MED ORDER — HYDROCODONE-ACETAMINOPHEN 5-325 MG PO TABS
ORAL_TABLET | ORAL | Status: AC
  Filled 2017-07-06: qty 1

## 2017-07-06 MED ORDER — CELECOXIB 200 MG PO CAPS: 200.0000 mg | ORAL_CAPSULE | ORAL | Status: DC

## 2017-07-06 MED ORDER — LACTATED RINGERS IV SOLN
INTRAVENOUS | Status: DC
  Administered 2017-07-06: 08:00:00 via INTRAVENOUS

## 2017-07-06 SURGICAL SUPPLY — 53 items
BANDAGE ELASTIC 6 LF NS (GAUZE/BANDAGES/DRESSINGS) ×1 IMPLANT
BLADE SURG 15 STRL SS SAFETY (BLADE) ×3 IMPLANT
BNDG CMPR MED 5X6 ELC HKLP NS (GAUZE/BANDAGES/DRESSINGS)
BNDG GAUZE 4.5X4.1 6PLY STRL (MISCELLANEOUS) ×1 IMPLANT
BULB RESERV EVAC DRAIN JP 100C (MISCELLANEOUS) IMPLANT
CANISTER SUCT 1200ML W/VALVE (MISCELLANEOUS) ×2 IMPLANT
CHLORAPREP W/TINT 26ML (MISCELLANEOUS) ×2 IMPLANT
CNTNR SPEC 2.5X3XGRAD LEK (MISCELLANEOUS)
CONT SPEC 4OZ STER OR WHT (MISCELLANEOUS)
CONT SPEC 4OZ STRL OR WHT (MISCELLANEOUS)
CONTAINER SPEC 2.5X3XGRAD LEK (MISCELLANEOUS) ×1 IMPLANT
COVER PROBE FLX POLY STRL (MISCELLANEOUS) ×2 IMPLANT
DEVICE DUBIN SPECIMEN MAMMOGRA (MISCELLANEOUS) ×2 IMPLANT
DRAIN CHANNEL JP 15F RND 16 (MISCELLANEOUS) IMPLANT
DRAPE LAPAROTOMY 100X77 ABD (DRAPES) ×1 IMPLANT
DRAPE LAPAROTOMY TRNSV 106X77 (MISCELLANEOUS) ×2 IMPLANT
DRSG TELFA 3X8 NADH (GAUZE/BANDAGES/DRESSINGS) ×2 IMPLANT
DRSG TELFA 4X3 1S NADH ST (GAUZE/BANDAGES/DRESSINGS) ×2 IMPLANT
ELECT CAUTERY BLADE TIP 2.5 (TIP) ×2
ELECT REM PT RETURN 9FT ADLT (ELECTROSURGICAL) ×2
ELECTRODE CAUTERY BLDE TIP 2.5 (TIP) ×1 IMPLANT
ELECTRODE REM PT RTRN 9FT ADLT (ELECTROSURGICAL) ×1 IMPLANT
GAUZE FLUFF 18X24 1PLY STRL (GAUZE/BANDAGES/DRESSINGS) ×2 IMPLANT
GAUZE SPONGE 4X4 12PLY STRL (GAUZE/BANDAGES/DRESSINGS) ×2 IMPLANT
GLOVE BIO SURGEON STRL SZ7.5 (GLOVE) ×4 IMPLANT
GLOVE INDICATOR 8.0 STRL GRN (GLOVE) ×4 IMPLANT
GOWN STRL REUS W/ TWL LRG LVL3 (GOWN DISPOSABLE) ×2 IMPLANT
GOWN STRL REUS W/TWL LRG LVL3 (GOWN DISPOSABLE) ×6
KIT TURNOVER KIT A (KITS) ×2 IMPLANT
LABEL OR SOLS (LABEL) ×2 IMPLANT
MARGIN MAP 10MM (MISCELLANEOUS) ×2 IMPLANT
NDL HYPO 25X1 1.5 SAFETY (NEEDLE) ×2 IMPLANT
NEEDLE HYPO 22GX1.5 SAFETY (NEEDLE) ×2 IMPLANT
NEEDLE HYPO 25X1 1.5 SAFETY (NEEDLE) ×4 IMPLANT
PACK BASIN MINOR ARMC (MISCELLANEOUS) ×2 IMPLANT
PAD DRESSING TELFA 3X8 NADH (GAUZE/BANDAGES/DRESSINGS) ×1 IMPLANT
SHEARS FOC LG CVD HARMONIC 17C (MISCELLANEOUS) IMPLANT
SHEARS HARMONIC 9CM CVD (BLADE) ×1 IMPLANT
STRIP CLOSURE SKIN 1/2X4 (GAUZE/BANDAGES/DRESSINGS) ×2 IMPLANT
SUT ETHILON 3-0 FS-10 30 BLK (SUTURE) ×2
SUT SILK 2 0 (SUTURE) ×2
SUT SILK 2-0 18XBRD TIE 12 (SUTURE) ×1 IMPLANT
SUT VIC AB 2-0 CT1 27 (SUTURE) ×2
SUT VIC AB 2-0 CT1 TAPERPNT 27 (SUTURE) ×1 IMPLANT
SUT VIC AB 4-0 FS2 27 (SUTURE) ×4 IMPLANT
SUT VICRYL+ 3-0 144IN (SUTURE) ×2 IMPLANT
SUTURE EHLN 3-0 FS-10 30 BLK (SUTURE) ×1 IMPLANT
SWABSTK COMLB BENZOIN TINCTURE (MISCELLANEOUS) ×2 IMPLANT
SYR 10ML LL (SYRINGE) ×2 IMPLANT
SYR BULB IRRIG 60ML STRL (SYRINGE) ×2 IMPLANT
SYR CONTROL 10ML (SYRINGE) ×2 IMPLANT
TAPE TRANSPORE STRL 2 31045 (GAUZE/BANDAGES/DRESSINGS) ×1 IMPLANT
WATER STERILE IRR 1000ML POUR (IV SOLUTION) ×2 IMPLANT

## 2017-07-06 NOTE — Op Note (Signed)
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  Postoperative diagnosis: Same.  Operative procedure: Wide excision right breast with ultrasound guidance.  Operating Surgeon: Hervey Ard, MD.  Anesthesia: General by LMA, Marcaine 0.5%, plain, 30 cc.  Clinical note: This 77 year old woman recently underwent a biopsy showing evidence of high-grade DCIS.  This was the 9 o'clock position of the right breast in the majority of the area was between the edge of the areola and the base of the nipple.  Given her options for sacrifice of the nipple areolar complex versus potential for return to the operating room if margins were positive the patient is chosen the latter.  Operative note: Ultrasound was used to identify the biopsy cavity and the area to be resected was marked prior to the instillation of local anesthesia.  Marcaine was infiltrated for comfort.  An elliptical incision extending across the edge of the areola was made at the 9 o'clock position approximately 3-1/2 cm in length.  The skin was incised sharply and the dermis divided with cutting current.  The tissue was shaved off the undersurface of the nipple areolar complex and a nylon marking suture "skin" was used to identify the area where the ductal structures entered the nipple.  Specimen radiograph was obtained after the block of tissue was resected with electrocautery.  The biopsy clip was in the middle of the specimen.  Frozen section was requested of the skin between the dermis and the mass and this was grossly negative for tumor.  Biopsy site changes noted.  After assuring good hemostasis the breast parenchyma was reapproximated with multiple layers of 2-0 Vicryl figure-of-eight sutures.  A 2-0 Vicryl pursestring suture was used to reconstruct the volume of the retroareolar space.  The skin was closed with a running 4-0 Vicryl subarticular suture.  Benzoin, Steri-Strips followed by Telfa and fluff gauze  and a surgical binder were applied.  The patient tolerated the procedure well and was taken to recovery room in stable condition.

## 2017-07-06 NOTE — Transfer of Care (Signed)
Immediate Anesthesia Transfer of Care Note  Patient: Janice Riley  Procedure(s) Performed: MASTECTOMY PARTIAL (Right ) BREAST RECONSTRUCTION WITH MASTOPLASTY (Right )  Patient Location: PACU  Anesthesia Type:General  Level of Consciousness: awake, alert  and oriented  Airway & Oxygen Therapy: Patient Spontanous Breathing and Patient connected to face mask oxygen  Post-op Assessment: Report given to RN and Post -op Vital signs reviewed and stable  Post vital signs: Reviewed and stable  Last Vitals:  Vitals:   07/06/17 0754 07/06/17 1021  BP: (!) 165/76 (!) 165/87  Pulse: 72 76  Resp: 17 (!) 25  Temp: 36.6 C (!) 36.2 C  SpO2: 100% 100%    Last Pain:  Vitals:   07/06/17 1021  TempSrc: Temporal         Complications: No apparent anesthesia complications

## 2017-07-06 NOTE — Anesthesia Procedure Notes (Signed)
Procedure Name: LMA Insertion Date/Time: 07/06/2017 8:56 AM Performed by: Johnna Acosta, CRNA Pre-anesthesia Checklist: Patient identified, Emergency Drugs available, Suction available, Patient being monitored and Timeout performed Patient Re-evaluated:Patient Re-evaluated prior to induction Oxygen Delivery Method: Circle system utilized Preoxygenation: Pre-oxygenation with 100% oxygen Induction Type: IV induction Ventilation: Mask ventilation without difficulty LMA: LMA inserted LMA Size: 4.0 Tube type: Oral Number of attempts: 1 Placement Confirmation: positive ETCO2 and breath sounds checked- equal and bilateral Tube secured with: Tape Dental Injury: Teeth and Oropharynx as per pre-operative assessment

## 2017-07-06 NOTE — Discharge Instructions (Signed)
AMBULATORY SURGERY  °DISCHARGE INSTRUCTIONS ° ° °1) The drugs that you were given will stay in your system until tomorrow so for the next 24 hours you should not: ° °A) Drive an automobile °B) Make any legal decisions °C) Drink any alcoholic beverage ° ° °2) You may resume regular meals tomorrow.  Today it is better to start with liquids and gradually work up to solid foods. ° °You may eat anything you prefer, but it is better to start with liquids, then soup and crackers, and gradually work up to solid foods. ° ° °3) Please notify your doctor immediately if you have any unusual bleeding, trouble breathing, redness and pain at the surgery site, drainage, fever, or pain not relieved by medication. ° ° ° °4) Additional Instructions: ° ° ° ° ° ° ° °Please contact your physician with any problems or Same Day Surgery at [phone], Monday through Friday 6 am to 4 pm, or [place] [facility] Main number at [phone]. °

## 2017-07-06 NOTE — H&P (Signed)
Janice Riley [id number] 1941-04-22     HPI:  77 y/o with right breast DCIS.  For wide excision.   Medications Prior to Admission  Medication Sig Dispense Refill Last Dose  . alendronate (FOSAMAX) 70 MG tablet Take 70 mg by mouth once weekly on Wednesday   Past Week at Unknown time  . Cholecalciferol (VITAMIN D3) 2000 units capsule Take 2,000 Units by mouth daily.    Past Week at Unknown time  . levothyroxine (SYNTHROID, LEVOTHROID) 112 MCG tablet Take 112 mcg by mouth daily before breakfast.    07/06/2017 at Unknown time   No Known Allergies Past Medical History:  Diagnosis Date  . Arthritis    FINGERS  . Cancer ([place]) 06/2017   breast, right  . Dysrhythmia 05/2017   complete workup by Dr. Nehemiah Massed.  all was good  . Hypothyroidism   . Thyroid disease    Past Surgical History:  Procedure Laterality Date  . 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 COLONOSCOPY  2016   Dr Candace Cruise   Social History   Socioeconomic History  . Marital status: Married    Spouse name: Not on file  . Number of children: Not on file  . Years of education: Not on file  . Highest education level: Not on file  Social Needs  . Financial resource strain: Not on file  . Food insecurity - worry: Not on file  . Food insecurity - inability: Not on file  . Transportation needs - medical: Not on file  . Transportation needs - non-medical: Not on file  Occupational History  . Not on file  Tobacco Use  . Smoking status: Never Smoker  . Smokeless tobacco: Never Used  Substance and Sexual Activity  . Alcohol use: Yes    Comment: wine   . Drug use: No  . Sexual activity: Not on file  Other Topics Concern  . Not on file  Social History Narrative  . Not on file   Social History   Social History Narrative  . Not on file     ROS: Negative.     PE: HEENT: Negative. Lungs: Clear. Cardio: [facility] Riley  Janice Riley 07/06/2017   Assessment/Plan:  Proceed with planned resection of DCIS. Reviewed plan for pathology review and possible resection of the nipple are areolar complex.

## 2017-07-06 NOTE — OR Nursing (Signed)
Discussed discharge instructions with pt and family. All voice understanding.

## 2017-07-06 NOTE — Anesthesia Preprocedure Evaluation (Signed)
Anesthesia Evaluation  Patient identified by MRN, date of birth, ID band Patient awake    Reviewed: Allergy & Precautions, H&P , NPO status , Patient's Chart, lab work & pertinent test results, reviewed documented beta blocker date and time   Airway Mallampati: II  TM Distance: >3 FB Neck ROM: full    Dental  (+) Teeth Intact, Upper Dentures, Lower Dentures   Pulmonary neg pulmonary ROS,    Pulmonary exam normal        Cardiovascular Exercise Tolerance: Good negative cardio ROS Normal cardiovascular exam+ dysrhythmias  Rate:Normal     Neuro/Psych negative neurological ROS  negative psych ROS   GI/Hepatic negative GI ROS, Neg liver ROS,   Endo/Other  negative endocrine ROSHypothyroidism   Renal/GU negative Renal ROS  negative genitourinary   Musculoskeletal   Abdominal   Peds  Hematology negative hematology ROS (+)   Anesthesia Other Findings   Reproductive/Obstetrics negative OB ROS                             Anesthesia Physical Anesthesia Plan  ASA: II  Anesthesia Plan: General LMA   Post-op Pain Management:    Induction:   PONV Risk Score and Plan:   Airway Management Planned:   Additional Equipment:   Intra-op Plan:   Post-operative Plan:   Informed Consent: I have reviewed the patients History and Physical, chart, labs and discussed the procedure including the risks, benefits and alternatives for the proposed anesthesia with the patient or authorized representative who has indicated his/her understanding and acceptance.     Plan Discussed with: CRNA  Anesthesia Plan Comments:         Anesthesia Quick Evaluation

## 2017-07-06 NOTE — Anesthesia Postprocedure Evaluation (Signed)
Anesthesia Post Note  Patient: Janice Riley  Procedure(s) Performed: MASTECTOMY PARTIAL (Right ) BREAST RECONSTRUCTION WITH MASTOPLASTY (Right )  Patient location during evaluation: PACU Anesthesia Type: General Level of consciousness: awake and alert Pain management: pain level controlled Vital Signs Assessment: post-procedure vital signs reviewed and stable Respiratory status: spontaneous breathing, nonlabored ventilation, respiratory function stable and patient connected to nasal cannula oxygen Cardiovascular status: blood pressure returned to baseline and stable Postop Assessment: no apparent nausea or vomiting Anesthetic complications: no     Last Vitals:  Vitals:   07/06/17 1113 07/06/17 1138  BP: 132/89 140/68  Pulse: 73 76  Resp: 16 16  Temp: 36.7 C   SpO2: 99% 99%    Last Pain:  Vitals:   07/06/17 1139  TempSrc:   PainSc: 3                  Molli Barrows

## 2017-07-06 NOTE — Anesthesia Post-op Follow-up Note (Signed)
Anesthesia QCDR form completed.        

## 2017-07-07 ENCOUNTER — Encounter: Payer: Self-pay | Admitting: General Surgery

## 2017-07-09 ENCOUNTER — Ambulatory Visit: Payer: Medicare Other | Admitting: General Surgery

## 2017-07-09 ENCOUNTER — Inpatient Hospital Stay (INDEPENDENT_AMBULATORY_CARE_PROVIDER_SITE_OTHER): Payer: Medicare Other

## 2017-07-09 ENCOUNTER — Other Ambulatory Visit: Payer: Self-pay

## 2017-07-09 VITALS — BP 144/86 | HR 86 | Resp 12 | Ht 64.0 in | Wt 148.0 lb

## 2017-07-09 DIAGNOSIS — D0511 Intraductal carcinoma in situ of right breast: Secondary | ICD-10-CM

## 2017-07-09 NOTE — Patient Instructions (Addendum)
Patient to see Dr. Baruch Gouty .  Follow up appointment to be announced.  Wear bra until breast doesn't feel heavy. The patient is aware to call back for any questions or concerns.

## 2017-07-09 NOTE — Progress Notes (Signed)
Patient ID: Janice Riley, female   DOB: 11/12/1940, 77 y.o.   MRN: [id number]  Chief Complaint  Patient presents with  . Routine Post Op    HPI Salimah Martinovich is a 77 y.o. female here today for her post op right mastectomy done on 07/06/2017. Patient ststes she is doing well. No pain.  HPI  Past Medical History:  Diagnosis Date  . Arthritis    FINGERS  . Cancer ([place]) 06/2017   breast, right  . Dysrhythmia 05/2017   complete workup by Dr. Nehemiah Massed.  all was good  . Hypothyroidism   . Thyroid disease     Past Surgical History:  Procedure Laterality Date  . 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 BREAST RECONSTRUCTION WITH MASTOPLASTY Right 07/06/2017   Procedure: BREAST RECONSTRUCTION WITH MASTOPLASTY;  Surgeon: Robert Bellow, MD;  Location: [facility] ORS;  Service: General;  Laterality: Right;  . COLONOSCOPY  2016   Dr Candace Cruise  . MASTECTOMY, PARTIAL Right 07/06/2017   Procedure: MASTECTOMY PARTIAL;  Surgeon: Robert Bellow, MD;  Location: [facility] ORS;  Service: General;  Laterality: Right;    Family History  Problem Relation Age of Onset  . Breast cancer Maternal Aunt     Social History Social History   Tobacco Use  . Smoking status: Never Smoker  . Smokeless tobacco: Never Used  Substance Use Topics  . Alcohol use: Yes    Comment: wine   . Drug use: No    No Known Allergies  Current Outpatient Medications  Medication Sig Dispense Refill  . alendronate (FOSAMAX) 70 MG tablet Take 70 mg by mouth once weekly on Wednesday    . Cholecalciferol (VITAMIN D3) 2000 units capsule Take 2,000 Units by mouth daily.     Marland Kitchen HYDROcodone-acetaminophen (NORCO/VICODIN) 5-325 MG tablet Take 1 tablet by mouth every 4 (four) hours as needed for moderate pain. 15 tablet 0  . levothyroxine (SYNTHROID, LEVOTHROID) 112 MCG tablet Take 112 mcg by mouth daily before breakfast.      No current facility-administered medications  for this visit.     Review of Systems Review of Systems  Constitutional: Negative.   Respiratory: Negative.   Cardiovascular: Negative.     Blood pressure (!) 144/86, pulse 86, resp. rate 12, height 5\' 4"  (1.626 m), weight 148 lb (67.1 kg).  Physical Exam Physical Exam  Constitutional: She is oriented to person, place, and time. She appears well-developed and well-nourished.  Pulmonary/Chest:    Neurological: She is alert and oriented to person, place, and time.  Skin: Skin is warm and dry.    Data Reviewed Diagnostic mammogram of January 16, 2017 described an 18 mm area of calcification suggestive for DCIS.  Biopsy completed January 26, 2017 had a 3 mm foci of DCIS.  Wide excision completed July 06, 2017: DIAGNOSIS:  A. RIGHT BREAST, 9:00; WIDE EXCISION:  - DUCTAL CARCINOMA IN SITU, NUCLEAR GRADE 3 WITH CALCIFICATIONS AND  FOCAL COMEDO NECROSIS.  - SURGICAL MARGINS ARE CLOSE BUT NEGATIVE, CLOSEST MARGIN LATERAL (<1  MM).  - BIOPSY SITE CHANGES, MARKER CLIP PRESENT.  - SEE SUMMARY BELOW.    Surgical Pathology Cancer Case Summary   DUCTAL CARCINOMA IN SITU OF THE BREAST:  Procedure: Excision  Specimen Laterality: Right  Size (Extent) of DCIS: at least 24 mm  Histologic Type: Ductal carcinoma in situ (DCIS)  Nuclear Grade: Grade III  Necrosis: Present, focal comedo necrosis  Margins:  Uninvolved by DCIS but close, closest margin is lateral (<1  mm)  ER and PR immunohistochemistry is obtained and results will be issued  in an addendum. DCIS is seen 11 mm from the skin.   Ultrasound examination was undertaken to determine if the patient would be a candidate for accelerated partial breast radiation.  There appears to be a reasonable although somewhat small seroma cavity measuring up to 1.7 cm that is 2 cm below the skin surface which will likely be adequate for balloon placement.  Assessment    Doing well status post excision of high-grade DCIS from the  right breast.    Plan  Lateral margin is less than 2 mm, would not recommend reexcision based on the patient's age.  Indication for radiation oncology consultation discussed.  Potential for whole breast versus partial breast radiation reviewed.   Patient to see Dr. Baruch Gouty .  Follow up appointment to be announced.  Wear bra until breast doesn't feel heavy. The patient is aware to call back for any questions or concerns.  We will arrange for a postoperative visit after her upcoming meeting with Dr. Baruch Gouty.  ER/PR pending at this time.  HPI, Physical Exam, Assessment and Plan have been scribed under the direction and in the presence of Hervey Ard, MD.  Gaspar Cola, CMA  I have completed the exam and reviewed the above documentation for accuracy and completeness.  I agree with the above.  Haematologist has been used and any errors in dictation or transcription are unintentional.  Hervey Ard, M.D., F.A.C.S.  [name] Badr Piedra 07/09/2017, 12:09 PM  Patient has been scheduled for an appointment to see Dr. Baruch Gouty at the [facility] for Tuesday, 07-14-17 at 2 pm. She is a possible mammosite candidate. The patient is aware of appointment date, time, and instructions.  The patient was instructed to keep follow up appointment with Dr. Bary Castilla as scheduled for 07-16-17 at 10 am. She verbalizes understanding.   Dominga Ferry, CMA

## 2017-07-10 LAB — SURGICAL PATHOLOGY

## 2017-07-14 ENCOUNTER — Encounter: Payer: Self-pay | Admitting: *Deleted

## 2017-07-14 ENCOUNTER — Encounter: Payer: Self-pay | Admitting: Radiation Oncology

## 2017-07-14 ENCOUNTER — Ambulatory Visit
Admission: RE | Admit: 2017-07-14 | Discharge: 2017-07-14 | Disposition: A | Discharge status: Home / Self Care | Payer: Medicare Other | Source: Ambulatory Visit | Attending: Radiation Oncology | Admitting: Radiation Oncology

## 2017-07-14 ENCOUNTER — Other Ambulatory Visit: Payer: Self-pay

## 2017-07-14 VITALS — BP 130/79 | HR 77 | Temp 95.7°F | Resp 20 | Wt 148.9 lb

## 2017-07-14 DIAGNOSIS — E039 Hypothyroidism, unspecified: Secondary | ICD-10-CM | POA: Diagnosis not present

## 2017-07-14 DIAGNOSIS — M129 Arthropathy, unspecified: Secondary | ICD-10-CM | POA: Insufficient documentation

## 2017-07-14 DIAGNOSIS — Z803 Family history of malignant neoplasm of breast: Secondary | ICD-10-CM | POA: Insufficient documentation

## 2017-07-14 DIAGNOSIS — D0511 Intraductal carcinoma in situ of right breast: Secondary | ICD-10-CM | POA: Diagnosis not present

## 2017-07-14 DIAGNOSIS — Z79899 Other long term (current) drug therapy: Secondary | ICD-10-CM | POA: Insufficient documentation

## 2017-07-14 DIAGNOSIS — I499 Cardiac arrhythmia, unspecified: Secondary | ICD-10-CM | POA: Diagnosis not present

## 2017-07-14 DIAGNOSIS — Z170 Estrogen receptor positive status [ER+]: Secondary | ICD-10-CM | POA: Insufficient documentation

## 2017-07-14 NOTE — Progress Notes (Signed)
  Oncology Nurse Navigator Documentation  Navigator Location: [facility] (07/14/17 1500) Referral date to RadOnc/MedOnc: 07/14/17 (07/14/17 1500) )Navigator Encounter Type: Initial RadOnc (07/14/17 1500)                         Barriers/Navigation Needs: Education (07/14/17 1500) Education: Newly Diagnosed Cancer Education (07/14/17 1500) Interventions: Education (07/14/17 1500)     Education Method: Verbal;Written (07/14/17 1500)      Acuity: Level 1 (07/14/17 1500)         Time Spent with Patient: 60 (07/14/17 1500)   Met patient and her husband during her initial radiation oncology consult.  Patient with DCIS of the left breast.  She is a candidate for mammosite and would to proceed with treatment.  Gave patient breast cancer educational literature, "My Breast Cancer Treatment Handbook" by Josephine Igo, RN.  She is to call if she has any questions or needs.

## 2017-07-14 NOTE — Consult Note (Signed)
NEW PATIENT EVALUATION  Name: Janice Riley  MRN: [id number]  Date:   07/14/2017     DOB: 1940-10-30   This 77 y.o. 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  REFERRING PHYSICIAN: Adin Hector, MD  CHIEF COMPLAINT:  Chief Complaint  Patient presents with  . Breast Cancer    Pt is here for initial consultation of mammosite.       DIAGNOSIS: The encounter diagnosis was Ductal carcinoma in situ (DCIS) of right breast.   PREVIOUS INVESTIGATIONS:  Pathology reports reviewed Mammograms and ultrasound reviewed Clinical notes reviewed Case presented at weekly breast cancer conference  HPI: Patient is a 77 year old female who presented with an abnormal mammogram showing a highly suspicious area of fine pleomorphic and fine linear branching calcifications in the upper right breast spanning approximately 1.8 cm. She underwent stereotactic biopsy positive for ductal carcinoma in situ. She underwent a wide local excision showing ductal carcinoma in situ nuclear grade 3 with calcifications and focal comedo necrosis. Margins were clear but close at less than 1 mm. The tumor spanned 2.4 cm. Tumor was strongly ER/PR positive. Patient has done well postoperatively and is now referred to radiation oncology for opinion. She specifically denies breast tenderness cough or bone pain.  PLANNED TREATMENT REGIMEN: Accelerated partial breast radiation  PAST MEDICAL HISTORY:  has a past medical history of Arthritis, Cancer ([place]) (06/2017), Dysrhythmia (05/2017), Hypothyroidism, and Thyroid disease.    PAST SURGICAL HISTORY:  Past Surgical History:  Procedure Laterality Date  . 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 BREAST RECONSTRUCTION WITH MASTOPLASTY Right 07/06/2017   Procedure: BREAST RECONSTRUCTION WITH MASTOPLASTY;  Surgeon:  Robert Bellow, MD;  Location: [facility] ORS;  Service: General;  Laterality: Right;  . COLONOSCOPY  2016   Dr Candace Cruise  . MASTECTOMY, PARTIAL Right 07/06/2017   Procedure: MASTECTOMY PARTIAL;  Surgeon: Robert Bellow, MD;  Location: [facility] ORS;  Service: General;  Laterality: Right;    FAMILY HISTORY: family history includes Breast cancer in her maternal aunt.  SOCIAL HISTORY:  reports that  has never smoked. she has never used smokeless tobacco. She reports that she drinks alcohol. She reports that she does not use drugs.  ALLERGIES: Patient has no known allergies.  MEDICATIONS:  Current Outpatient Medications  Medication Sig Dispense Refill  . alendronate (FOSAMAX) 70 MG tablet Take 70 mg by mouth once weekly on Wednesday    . Cholecalciferol (VITAMIN D3) 2000 units capsule Take 2,000 Units by mouth daily.     Marland Kitchen levothyroxine (SYNTHROID, LEVOTHROID) 112 MCG tablet Take 112 mcg by mouth daily before breakfast.     . HYDROcodone-acetaminophen (NORCO/VICODIN) 5-325 MG tablet Take 1 tablet by mouth every 4 (four) hours as needed for moderate pain. (Patient not taking: Reported on 07/14/2017) 15 tablet 0   No current facility-administered medications for this encounter.     ECOG PERFORMANCE STATUS:  0 - Asymptomatic  REVIEW OF SYSTEMS:  Patient denies any weight loss, fatigue, weakness, fever, chills or night sweats. Patient denies any loss of vision, blurred vision. Patient denies any ringing  of the ears or hearing loss. No irregular heartbeat. Patient denies heart murmur or history of fainting. Patient denies any chest pain or pain radiating to her upper extremities. Patient denies any shortness of breath, difficulty breathing at night, cough or hemoptysis.  Patient denies any swelling in the lower legs. Patient denies any nausea vomiting, vomiting of blood, or coffee ground material in the vomitus. Patient denies any stomach pain. Patient states has had normal bowel movements no significant  constipation or diarrhea. Patient denies any dysuria, hematuria or significant nocturia. Patient denies any problems walking, swelling in the joints or loss of balance. Patient denies any skin changes, loss of hair or loss of weight. Patient denies any excessive worrying or anxiety or significant depression. Patient denies any problems with insomnia. Patient denies excessive thirst, polyuria, polydipsia. Patient denies any swollen glands, patient denies easy bruising or easy bleeding. Patient denies any recent infections, allergies or URI. Patient "s visual fields have not changed significantly in recent time.    PHYSICAL EXAM: BP 130/79   Pulse 77   Temp (!) 95.7 F (35.4 C)   Resp 20   Wt 148 lb 14.7 oz (67.5 kg)   BMI 25.56 kg/m  Patient is status post wide local excision of the white right breast which is healing well. There is probable small seroma associated with her lumpectomy site. No other dominant mass or nodularity is noted in either breast in 2 positions examined. No axillary or supraclavicular adenopathy is appreciated. Well-developed well-nourished patient in NAD. HEENT reveals PERLA, EOMI, discs not visualized.  Oral cavity is clear. No oral mucosal lesions are identified. Neck is clear without evidence of cervical or supraclavicular adenopathy. Lungs are clear to A&P. Cardiac examination is essentially unremarkable with regular rate and rhythm without murmur rub or thrill. Abdomen is benign with no organomegaly or masses noted. Motor sensory and DTR levels are equal and symmetric in the upper and lower extremities. Cranial nerves II through XII are grossly intact. Proprioception is intact. No peripheral adenopathy or edema is identified. No motor or sensory levels are noted. Crude visual fields are within normal range.  LABORATORY DATA: Pathology reports reviewed    RADIOLOGY RESULTS: Mammogram and ultrasound reviewed   IMPRESSION: Stage 0 ER/PR positive ductal carcinoma in situ  (Tis N0 M0) status post wide local excision in 77 year old female  PLAN: At this time I have recommended adjuvant radiation therapy. I've gone over both whole breast radiation as well as accelerated partial breast radiation. Risks and benefits and treatment strategy of both treatment plans were reviewed in detail. Patient has except an accelerated partial breast radiation and we will make arrangements for MammoSite balloon placement. Risks and benefits of the catheter placement including possible small area of skin involvement with erythematous changes, permanent thickening of the lumpectomy site fatigue all were discussed in detail with the patient. Would plan on delivering 3400 cGy in 10 fractions at 340 cGy twice a day using high dose rate remote afterloading through the MammoSite balloon catheter. We will coordinate with physics and my staff on balloon MammoSite placement as well as follow-up CT simulation. I've also advised the patient should the balloon not be amenable to treatment would recommend whole breast radiation.  I would like to take this opportunity to thank you for allowing me to participate in the care of your patient.Noreene Filbert, MD

## 2017-07-16 ENCOUNTER — Ambulatory Visit (INDEPENDENT_AMBULATORY_CARE_PROVIDER_SITE_OTHER): Payer: Medicare Other | Admitting: General Surgery

## 2017-07-16 ENCOUNTER — Encounter: Payer: Self-pay | Admitting: General Surgery

## 2017-07-16 VITALS — BP 120/70 | HR 68 | Resp 14 | Ht 65.0 in | Wt 150.0 lb

## 2017-07-16 DIAGNOSIS — D0511 Intraductal carcinoma in situ of right breast: Secondary | ICD-10-CM

## 2017-07-16 MED ORDER — CEFADROXIL 500 MG PO CAPS: 500.0000 mg | ORAL_CAPSULE | Freq: Two times a day (BID) | ORAL | 0 refills | Status: DC

## 2017-07-16 NOTE — Progress Notes (Signed)
Patient ID: Janice Riley, female   DOB: 1941/03/23, 77 y.o.   MRN: [id number]  Chief Complaint  Patient presents with  . Follow-up    HPI Janice Riley is a 77 y.o. female here today for her post op right partial mastectomy done on 07/06/2017. Patient states she is doing well.    HPI  Past Medical History:  Diagnosis Date  . Arthritis    FINGERS  . Cancer ([place]) 06/2017   breast, right  . Dysrhythmia 05/2017   complete workup by Dr. Nehemiah Massed.  all was good  . Hypothyroidism   . Thyroid disease     Past Surgical History:  Procedure Laterality Date  . 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 BREAST RECONSTRUCTION WITH MASTOPLASTY Right 07/06/2017   Procedure: BREAST RECONSTRUCTION WITH MASTOPLASTY;  Surgeon: Robert Bellow, MD;  Location: [facility] ORS;  Service: General;  Laterality: Right;  . COLONOSCOPY  2016   Dr Candace Cruise  . MASTECTOMY, PARTIAL Right 07/06/2017   Procedure: MASTECTOMY PARTIAL;  Surgeon: Robert Bellow, MD;  Location: [facility] ORS;  Service: General;  Laterality: Right;    Family History  Problem Relation Age of Onset  . Breast cancer Maternal Aunt     Social History Social History   Tobacco Use  . Smoking status: Never Smoker  . Smokeless tobacco: Never Used  Substance Use Topics  . Alcohol use: Yes    Comment: wine   . Drug use: No    No Known Allergies  Current Outpatient Medications  Medication Sig Dispense Refill  . alendronate (FOSAMAX) 70 MG tablet Take 70 mg by mouth once weekly on Wednesday    . Cholecalciferol (VITAMIN D3) 2000 units capsule Take 2,000 Units by mouth daily.     Marland Kitchen HYDROcodone-acetaminophen (NORCO/VICODIN) 5-325 MG tablet Take 1 tablet by mouth every 4 (four) hours as needed for moderate pain. 15 tablet 0  . levothyroxine (SYNTHROID, LEVOTHROID) 112 MCG tablet Take 112 mcg by mouth daily before breakfast.     . cefadroxil (DURICEF) 500 MG capsule Take 1  capsule (500 mg total) by mouth 2 (two) times daily. Start this at 1 pm the day of the office procedure 07-29-17 20 capsule 0   No current facility-administered medications for this visit.     Review of Systems Review of Systems  Blood pressure 120/70, pulse 68, resp. rate 14, height '5\' 5"'  (1.651 m), weight 150 lb (68 kg).  Physical Exam Physical Exam  Constitutional: She is oriented to person, place, and time. She appears well-developed and well-nourished.  Pulmonary/Chest:  Right breast incision clean  Neurological: She is alert and oriented to person, place, and time.  Skin: Skin is warm and dry.  Psychiatric: She has a normal mood and affect.    Data Reviewed A. RIGHT BREAST, 9:00; WIDE EXCISION:  - DUCTAL CARCINOMA IN SITU, NUCLEAR GRADE 3 WITH CALCIFICATIONS AND  FOCAL COMEDO NECROSIS.  - SURGICAL MARGINS ARE CLOSE BUT NEGATIVE, CLOSEST MARGIN LATERAL (<1  MM).  - BIOPSY SITE CHANGES, MARKER CLIP PRESENT.  - SEE SUMMARY BELOW.    Surgical Pathology Cancer Case Summary   DUCTAL CARCINOMA IN SITU OF THE BREAST:  Procedure: Excision  Specimen Laterality: Right  Size (Extent) of DCIS: at least 24 mm  Histologic Type: Ductal carcinoma in situ (DCIS)  Nuclear Grade: Grade III  Necrosis: Present, focal comedo necrosis  Margins: Uninvolved by DCIS but close, closest margin is  lateral (<1  mm)  Regional Lymph nodes: Lymph nodes not submitted or found  Pathologic Stage Classification (pTNM, AJCC 8th Edition): pTis pNx  TNM Descriptors: Not applicable  ER: 81%; PR: 50%.  While the lateral margin is less than 2 mm, based on plans for accelerated breast radiation and age, would not recommend reexcision.  Assessment    Doing well status post wide excision right breast DCIS.    Plan    The patient has met with radiation oncology and has elected to proceed with accelerated partial breast radiation.  The procedure was reviewed. Discussed mammosite, plan 07-29-17 at  2:00. [facility] sent Instructions reviewed     HPI, Physical Exam, Assessment and Plan have been scribed under the direction and in the presence of Robert Bellow, MD. Karie Fetch, RN  I have completed the exam and reviewed the above documentation for accuracy and completeness.  I agree with the above.  Haematologist has been used and any errors in dictation or transcription are unintentional.  Hervey Ard, M.D., F.A.C.S.  [name] Hashem Goynes 07/16/2017, 1:26 PM

## 2017-07-16 NOTE — Patient Instructions (Addendum)
The patient is aware to call back for any questions or new concerns. Mammosite schedule reviewed with the patient Placement 07-29-17  at [facility] at 2:00 Scan 07-31-17 Treat March 18-22 Aware the [place] will be calling her for more details Aware of ATB and directions reviewed. Aware no showers and to wear her bra while mammosite in place.

## 2017-07-29 ENCOUNTER — Ambulatory Visit (INDEPENDENT_AMBULATORY_CARE_PROVIDER_SITE_OTHER): Payer: Medicare Other | Admitting: General Surgery

## 2017-07-29 ENCOUNTER — Inpatient Hospital Stay: Payer: Self-pay

## 2017-07-29 ENCOUNTER — Encounter: Payer: Self-pay | Admitting: General Surgery

## 2017-07-29 VITALS — BP 128/78 | HR 82 | Resp 12 | Ht 65.0 in | Wt 149.0 lb

## 2017-07-29 DIAGNOSIS — D0511 Intraductal carcinoma in situ of right breast: Secondary | ICD-10-CM

## 2017-07-29 NOTE — Progress Notes (Signed)
Patient ID: Janice Riley, female   DOB: 03-31-1941, 77 y.o.   MRN: [id number]  Chief Complaint  Patient presents with  . Procedure    HPI Janice Riley is a 77 y.o. female.  Here today for right breast mammosite.  The patient has been evaluated by radiation oncology and found to be an suitable candidate for accelerated partial breast radiation.  She did make use of her antibiotics as requested.  Procedure was reviewed once again with indications and risk. HPI  Past Medical History:  Diagnosis Date  . Arthritis    FINGERS  . Cancer ([place]) 06/2017   breast, right  . Dysrhythmia 05/2017   complete workup by Dr. Nehemiah Massed.  all was good  . Hypothyroidism   . Thyroid disease     Past Surgical History:  Procedure Laterality Date  . 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 BREAST RECONSTRUCTION WITH MASTOPLASTY Right 07/06/2017   Procedure: BREAST RECONSTRUCTION WITH MASTOPLASTY;  Surgeon: Robert Bellow, MD;  Location: [facility] OR[facility];  Service: General;  Laterality: Right;  . COLONOSCOPY  2016   Dr Candace Cruise  . MASTECTOMY, PARTIAL Right 07/06/2017   Procedure: MASTECTOMY PARTIAL;  Surgeon: Robert Bellow, MD;  Location: [facility];  Service: General;  Laterality: Right;    Family History  Problem Relation Age of Onset  . Breast cancer Maternal Aunt     Social History Social History   Tobacco Use  . Smoking status: Never Smoker  . Smokeless tobacco: Never Used  Substance Use Topics  . Alcohol use: Yes    Comment: wine   . Drug use: No    No Known Allergies  Current Outpatient Medications  Medication Sig Dispense Refill  . alendronate (FOSAMAX) 70 MG tablet Take 70 mg by mouth once weekly on Wednesday    . cefadroxil (DURICEF) 500 MG capsule Take 1 capsule (500 mg total) by mouth 2 (two) times daily. Start this at 1 pm the day of the office procedure 07-29-17 20 capsule 0  . Cholecalciferol (VITAMIN D3) 2000 units  capsule Take 2,000 Units by mouth daily.     Marland Kitchen HYDROcodone-acetaminophen (NORCO/VICODIN) 5-325 MG tablet Take 1 tablet by mouth every 4 (four) hours as needed for moderate pain. 15 tablet 0  . levothyroxine (SYNTHROID, LEVOTHROID) 112 MCG tablet Take 112 mcg by mouth daily before breakfast.      No current facility-administered medications for this visit.     Review of Systems Review of Systems  Constitutional: Negative.   Respiratory: Negative.   Cardiovascular: Negative.     Blood pressure 128/78, pulse 82, resp. rate 12, height 5\' 5"  (1.651 m), weight 149 lb (67.6 kg).  Physical Exam Physical Exam  Constitutional: She is oriented to person, place, and time. She appears well-developed and well-nourished.  Pulmonary/Chest:    Neurological: She is alert and oriented to person, place, and time.  Skin: Skin is warm and dry.  Psychiatric: Her behavior is normal.    Data Reviewed  Ultrasound examination showed a small seroma cavity at the depth of the wound adequate for balloon placement.  ChloraPrep was applied to the skin x3.  10 cc of 0.5% Xylocaine with 0.25% Marcaine with 1 200,000 units of epinephrine was instilled approximately 5 cm for the edge of the incision.  A 9 8 mm incision was made followed by passage of the 8 mm trocar into the cavity.  The cavity evaluation device  was advanced under ultrasound guidance.  This was inflated with 45 cc of sterile saline with spherical inflation and adequate spacing to the overlying skin at 1.96 cm.  The treatment balloon was examined and inflated with 70 cc of saline.  It was spherical.  The balloon was deflated and the treatment balloon inserted in place of the cavity evaluation device.  This was inflated with a mix of saline and Omnipaque.  45 cc volume utilized.  Inspection showed spherical inflation and a minimum skin distance of 1.83 cm.  Bacitracin was applied to the balloon exit site followed by a bulky gauze dressing and  tape.  The patient tolerated the procedure well.  Assessment    Successful MammoSite balloon placement.    Plan    The patient is scheduled for CT scan on Friday, March 15 for balloon assessment and will begin treatment on Monday, March 18.  We will arrange for a follow-up examination here in 2 weeks.     HPI, Physical Exam, Assessment and Plan have been scribed under the direction and in the presence of Robert Bellow, MD. Karie Fetch, RN  I have completed the exam and reviewed the above documentation for accuracy and completeness.  I agree with the above.  Haematologist has been used and any errors in dictation or transcription are unintentional.  Hervey Ard, M.D., F.A.C.S. [name] Byrnett 07/29/2017, 3:06 PM

## 2017-07-29 NOTE — Patient Instructions (Signed)
Patient care kit given to patient.  Instructed no showers, sponge bath while mammosite in place, take antibiotic. Follow up with [facility] as arranged. Discussed wearing your bra for support at all times.

## 2017-07-31 ENCOUNTER — Ambulatory Visit
Admission: RE | Admit: 2017-07-31 | Discharge: 2017-07-31 | Disposition: A | Discharge status: Home / Self Care | Payer: Medicare Other | Source: Ambulatory Visit | Attending: Radiation Oncology | Admitting: Radiation Oncology

## 2017-07-31 DIAGNOSIS — D0511 Intraductal carcinoma in situ of right breast: Secondary | ICD-10-CM | POA: Insufficient documentation

## 2017-07-31 DIAGNOSIS — Z510 Encounter for antineoplastic radiation therapy: Secondary | ICD-10-CM | POA: Insufficient documentation

## 2017-08-03 ENCOUNTER — Ambulatory Visit
Admission: RE | Admit: 2017-08-03 | Discharge: 2017-08-03 | Disposition: A | Discharge status: Home / Self Care | Payer: Medicare Other | Source: Ambulatory Visit | Attending: Radiation Oncology | Admitting: Radiation Oncology

## 2017-08-03 DIAGNOSIS — D0511 Intraductal carcinoma in situ of right breast: Secondary | ICD-10-CM | POA: Diagnosis not present

## 2017-08-04 ENCOUNTER — Ambulatory Visit
Admission: RE | Admit: 2017-08-04 | Discharge: 2017-08-04 | Disposition: A | Discharge status: Home / Self Care | Payer: Medicare Other | Source: Ambulatory Visit | Attending: Radiation Oncology | Admitting: Radiation Oncology

## 2017-08-04 DIAGNOSIS — D0511 Intraductal carcinoma in situ of right breast: Secondary | ICD-10-CM | POA: Diagnosis not present

## 2017-08-05 ENCOUNTER — Ambulatory Visit
Admission: RE | Admit: 2017-08-05 | Discharge: 2017-08-05 | Disposition: A | Discharge status: Home / Self Care | Payer: Medicare Other | Source: Ambulatory Visit | Attending: Radiation Oncology | Admitting: Radiation Oncology

## 2017-08-05 DIAGNOSIS — D0511 Intraductal carcinoma in situ of right breast: Secondary | ICD-10-CM | POA: Diagnosis not present

## 2017-08-06 ENCOUNTER — Ambulatory Visit
Admission: RE | Admit: 2017-08-06 | Discharge: 2017-08-06 | Disposition: A | Discharge status: Home / Self Care | Payer: Medicare Other | Source: Ambulatory Visit | Attending: Radiation Oncology | Admitting: Radiation Oncology

## 2017-08-06 DIAGNOSIS — D0511 Intraductal carcinoma in situ of right breast: Secondary | ICD-10-CM | POA: Diagnosis not present

## 2017-08-07 ENCOUNTER — Ambulatory Visit
Admission: RE | Admit: 2017-08-07 | Discharge: 2017-08-07 | Disposition: A | Discharge status: Home / Self Care | Payer: Medicare Other | Source: Ambulatory Visit | Attending: Radiation Oncology | Admitting: Radiation Oncology

## 2017-08-07 DIAGNOSIS — D0511 Intraductal carcinoma in situ of right breast: Secondary | ICD-10-CM | POA: Diagnosis not present

## 2017-08-13 ENCOUNTER — Ambulatory Visit (INDEPENDENT_AMBULATORY_CARE_PROVIDER_SITE_OTHER): Payer: Medicare Other | Admitting: General Surgery

## 2017-08-13 ENCOUNTER — Encounter: Payer: Self-pay | Admitting: General Surgery

## 2017-08-13 VITALS — BP 122/68 | HR 80 | Resp 14 | Ht 65.0 in | Wt 147.0 lb

## 2017-08-13 DIAGNOSIS — D0511 Intraductal carcinoma in situ of right breast: Secondary | ICD-10-CM

## 2017-08-13 MED ORDER — TAMOXIFEN CITRATE 20 MG PO TABS: 20.0000 mg | ORAL_TABLET | Freq: Every day | ORAL | 12 refills | Status: DC

## 2017-08-13 NOTE — Progress Notes (Signed)
Patient ID: Janice Riley, female   DOB: 09/28/1940, 77 y.o.   MRN: [id number]  Chief Complaint  Patient presents with  . Follow-up    HPI Janice Riley is a 77 y.o. female here today for her follow up right breast cancer. Finished her treatment on 08/07/2017.  HPI  Past Medical History:  Diagnosis Date  . Arthritis    FINGERS  . Breast cancer, stage 0, right 07/06/2017   High grade DCIS, lateral margin 18mm. ER/PR positive. Mammosite RX.   Marland Kitchen Dysrhythmia 05/2017   complete workup by Dr. Nehemiah Massed.  all was good  . Hypothyroidism   . Thyroid disease     Past Surgical History:  Procedure Laterality Date  . 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 BREAST RECONSTRUCTION WITH MASTOPLASTY Right 07/06/2017   Procedure: BREAST RECONSTRUCTION WITH MASTOPLASTY;  Surgeon: Robert Bellow, MD;  Location: [facility] ORS;  Service: General;  Laterality: Right;  . COLONOSCOPY  2016   Dr Candace Cruise  . MASTECTOMY, PARTIAL Right 07/06/2017   Procedure: MASTECTOMY PARTIAL;  Surgeon: Robert Bellow, MD;  Location: [facility] ORS;  Service: General;  Laterality: Right;    Family History  Problem Relation Age of Onset  . Breast cancer Maternal Aunt     Social History Social History   Tobacco Use  . Smoking status: Never Smoker  . Smokeless tobacco: Never Used  Substance Use Topics  . Alcohol use: Yes    Comment: wine   . Drug use: No    No Known Allergies  Current Outpatient Medications  Medication Sig Dispense Refill  . alendronate (FOSAMAX) 70 MG tablet Take 70 mg by mouth once weekly on Wednesday    . Cholecalciferol (VITAMIN D3) 2000 units capsule Take 2,000 Units by mouth daily.     Marland Kitchen levothyroxine (SYNTHROID, LEVOTHROID) 112 MCG tablet Take 112 mcg by mouth daily before breakfast.     . tamoxifen (NOLVADEX) 20 MG tablet Take 1 tablet (20 mg total) by mouth daily. 30 tablet 12   No current facility-administered medications for  this visit.     Review of Systems Review of Systems  Constitutional: Negative.   Respiratory: Negative.     Blood pressure 122/68, pulse 80, resp. rate 14, height 5\' 5"  (1.651 m), weight 147 lb (66.7 kg).  Physical Exam Physical Exam  Constitutional: She is oriented to person, place, and time. She appears well-developed and well-nourished.  Pulmonary/Chest:    Neurological: She is alert and oriented to person, place, and time.  Skin: Skin is warm and dry.    Data Reviewed Pathology of resected specimen, high-grade DCIS.  ER: 90%; PR: 50%  Bone density September 2017 showed osteoporosis with a T score of -2.5.  Repeat exam would be due in September 2019.  Assessment    Doing well post accelerated partial breast radiation.    Plan  Indications for adjuvant anti-hormonal therapy reviewed.  Has been on Boniva for years for bone support.  Likely better managed with tamoxifen then an aromatase inhibitor.  Risks associated with tamoxifen including DVT and uterine malignancy discussed.  Patient amenable to a 1 month trial. Patient to return in one month. Tamoxifen send to pharmacy.Patient to taking tamoxifen for one month and report at next office visit. Take a baby aspirin 81mg  daily. ,  The patient is aware to call back for any questions or concerns.    HPI, Physical Exam, Assessment and Plan  have been scribed under the direction and in the presence of Hervey Ard, MD.  Gaspar Cola, CMA  I have completed the exam and reviewed the above documentation for accuracy and completeness.  I agree with the above.  Haematologist has been used and any errors in dictation or transcription are unintentional.  Hervey Ard, M.D., F.A.C.S.  [name] Byrnett 08/13/2017, 10:39 AM

## 2017-08-13 NOTE — Patient Instructions (Addendum)
Patient to return in one month. Tamoxifen send to pharmacy.Patient to taking tamoxifen for one month and report at next office visit. Take a baby aspirin 81mg  daily. ,  The patient is aware to call back for any questions or concerns.

## 2017-09-09 ENCOUNTER — Other Ambulatory Visit: Payer: Self-pay

## 2017-09-09 ENCOUNTER — Encounter: Payer: Self-pay | Admitting: Radiation Oncology

## 2017-09-09 ENCOUNTER — Ambulatory Visit
Admission: RE | Admit: 2017-09-09 | Discharge: 2017-09-09 | Disposition: A | Discharge status: Home / Self Care | Payer: Medicare Other | Source: Ambulatory Visit | Attending: Radiation Oncology | Admitting: Radiation Oncology

## 2017-09-09 VITALS — BP 135/70 | HR 77 | Temp 98.2°F | Wt 147.8 lb

## 2017-09-09 DIAGNOSIS — Z79810 Long term (current) use of selective estrogen receptor modulators (SERMs): Secondary | ICD-10-CM | POA: Diagnosis not present

## 2017-09-09 DIAGNOSIS — Z170 Estrogen receptor positive status [ER+]: Secondary | ICD-10-CM | POA: Insufficient documentation

## 2017-09-09 DIAGNOSIS — D0511 Intraductal carcinoma in situ of right breast: Secondary | ICD-10-CM | POA: Diagnosis present

## 2017-09-09 DIAGNOSIS — Z923 Personal history of irradiation: Secondary | ICD-10-CM | POA: Insufficient documentation

## 2017-09-09 NOTE — Progress Notes (Signed)
Radiation Oncology Follow up Note  Name: Janice Riley   Date:   09/09/2017 MRN:  [id number] DOB: 07-Nov-1940    This 77 y.o. female presents to the clinic today for one-month follow-up status post accelerated partial breast radiation to her right breast for ductal carcinoma in situ.  REFERRING PROVIDER: Adin Hector, MD  HPI: patient is a 77 year old female now seen out 1 month having completed accelerated partial breast radiation to her right breast for ER/PR positive.ductal carcinoma in situ seen today in routine follow she is doing well. She specifically denies breast tenderness cough or bone pain. Patient has been started on tamoxifen is tolerating that well without side effect.  COMPLICATIONS OF TREATMENT: none  FOLLOW UP COMPLIANCE: keeps appointments   PHYSICAL EXAM:  BP 135/70   Pulse 77   Temp 98.2 F (36.8 C)   Wt 147 lb 13.1 oz (67 kg)   BMI 24.60 kg/m  Lungs are clear to A&P cardiac examination essentially unremarkable with regular rate and rhythm. No dominant mass or nodularity is noted in either breast in 2 positions examined. Incision is well-healed. No axillary or supraclavicular adenopathy is appreciated. Cosmetic result is excellent. Well-developed well-nourished patient in NAD. HEENT reveals PERLA, EOMI, discs not visualized.  Oral cavity is clear. No oral mucosal lesions are identified. Neck is clear without evidence of cervical or supraclavicular adenopathy. Lungs are clear to A&P. Cardiac examination is essentially unremarkable with regular rate and rhythm without murmur rub or thrill. Abdomen is benign with no organomegaly or masses noted. Motor sensory and DTR levels are equal and symmetric in the upper and lower extremities. Cranial nerves II through XII are grossly intact. Proprioception is intact. No peripheral adenopathy or edema is identified. No motor or sensory levels are noted. Crude visual fields are within normal range.  RADIOLOGY RESULTS: no current  films for review  PLAN: present time patient is doing well. She's currently on tamoxifen tolerating that well without side effect. I'm please were overall progress. I've asked to see her back in 4-5 months for follow-up.Patient is to call sooner with any concerns.  I would like to take this opportunity to thank you for allowing me to participate in the care of your patient.Noreene Filbert, MD

## 2017-09-22 ENCOUNTER — Ambulatory Visit (INDEPENDENT_AMBULATORY_CARE_PROVIDER_SITE_OTHER): Payer: Medicare Other | Admitting: General Surgery

## 2017-09-22 ENCOUNTER — Encounter: Payer: Self-pay | Admitting: General Surgery

## 2017-09-22 VITALS — BP 124/74 | HR 73 | Resp 14 | Ht 66.0 in | Wt 147.0 lb

## 2017-09-22 DIAGNOSIS — D0511 Intraductal carcinoma in situ of right breast: Secondary | ICD-10-CM

## 2017-09-22 NOTE — Progress Notes (Signed)
Patient ID: Janice Riley, female   DOB: 12-17-1940, 77 y.o.   MRN: [id number]  Chief Complaint  Patient presents with  . Follow-up    HPI Janice Riley is a 77 y.o. female here today for her follow up right lumpectomy. Patient states she is doing well. She states she has been having more head aches since starting on the Tamoxifen. Little bit of hot flashes.  HPI  Past Medical History:  Diagnosis Date  . Arthritis    FINGERS  . 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 ER/PR positive. Mammosite RX.   Marland Kitchen Dysrhythmia 05/2017   complete workup by Dr. Nehemiah Massed.  all was good  . Hypothyroidism   . Thyroid disease     Past Surgical History:  Procedure Laterality Date  . 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 BREAST RECONSTRUCTION WITH MASTOPLASTY Right 07/06/2017   Procedure: BREAST RECONSTRUCTION WITH MASTOPLASTY;  Surgeon: Robert Bellow, MD;  Location: [facility] ORS;  Service: General;  Laterality: Right;  . COLONOSCOPY  2016   Dr Candace Cruise  . MASTECTOMY, PARTIAL Right 07/06/2017   Procedure: MASTECTOMY PARTIAL;  Surgeon: Robert Bellow, MD;  Location: [facility] ORS;  Service: General;  Laterality: Right;    Family History  Problem Relation Age of Onset  . Breast cancer Maternal Aunt     Social History Social History   Tobacco Use  . Smoking status: Never Smoker  . Smokeless tobacco: Never Used  Substance Use Topics  . Alcohol use: Yes    Comment: wine   . Drug use: No    No Known Allergies  Current Outpatient Medications  Medication Sig Dispense Refill  . alendronate (FOSAMAX) 70 MG tablet Take 70 mg by mouth once weekly on Wednesday    . Cholecalciferol (VITAMIN D3) 2000 units capsule Take 2,000 Units by mouth daily.     Marland Kitchen levothyroxine (SYNTHROID, LEVOTHROID) 112 MCG tablet Take 112 mcg by mouth daily before breakfast.     . tamoxifen (NOLVADEX) 20 MG tablet Take 1  tablet (20 mg total) by mouth daily. 30 tablet 12   No current facility-administered medications for this visit.     Review of Systems Review of Systems  Constitutional: Negative.   Respiratory: Negative.   Cardiovascular: Negative.   Neurological: Positive for headaches (new since initiation of Tamoxifen. ).    Blood pressure 124/74, pulse 73, resp. rate 14, height 5\' 6"  (1.676 m), weight 147 lb (66.7 kg).  Physical Exam Physical Exam  Constitutional: She is oriented to person, place, and time. She appears well-developed and well-nourished.  Eyes: Conjunctivae are normal. No scleral icterus.  Neck: Normal range of motion.  Cardiovascular: Normal rate, regular rhythm and normal heart sounds.  Pulmonary/Chest: Effort normal and breath sounds normal. Right breast exhibits no inverted nipple, no mass, no nipple discharge, no skin change and no tenderness. Left breast exhibits no inverted nipple, no mass, no nipple discharge, no skin change and no tenderness.  Right breast incision is clean and well healed.     Lymphadenopathy:    She has no cervical adenopathy.  Neurological: She is alert and oriented to person, place, and time.  Skin: Skin is warm and dry.    Data Reviewed No new data.  Assessment    Doing well status post wide excision of her right breast cancer.  Modest symptoms related to initiation of tamoxifen, not  so severe as to warrant making use of Tylenol at this time.    Plan  Patient to call and report around the 4th of July about her Tamoxifen. Keep taking the baby aspirin. Return in September for her bilateral diagnotic mammogram.   If she remains symptomatic with headaches will consider change to aromatase inhibitor.  HPI, Physical Exam, Assessment and Plan have been scribed under the direction and in the presence of Hervey Ard, MD.  Gaspar Cola, CMA  I have completed the exam and reviewed the above documentation for accuracy and completeness.   I agree with the above.  Haematologist has been used and any errors in dictation or transcription are unintentional.  Hervey Ard, M.D., F.A.C.S.  Janice Riley 09/23/2017, 5:31 PM

## 2017-09-22 NOTE — Patient Instructions (Addendum)
Patient to call and report around the 4th of July about her Tamoxifen. Keep taking baby aspirin daily. Return in September for her bilateral diagnotic mammogram.

## 2017-09-23 ENCOUNTER — Encounter: Payer: Self-pay | Admitting: *Deleted

## 2017-09-23 NOTE — Progress Notes (Signed)
  Oncology Nurse Navigator Documentation  Navigator Location: [facility] (09/23/17 1500)   )Navigator Encounter Type: Letter/Fax/Email (09/23/17 1500)                                                    Time Spent with Patient: 15 (09/23/17 1500)   Thinking of you card mailed to patient.  She has completed her radiation therapy.

## 2017-11-23 ENCOUNTER — Telehealth: Payer: Self-pay | Admitting: *Deleted

## 2017-11-23 NOTE — Telephone Encounter (Signed)
Patient called to give an update on how she is doing on Tamoxifen. She stated that she is still having side effects but they are minor. Her headaches are not as bad, she is still a little anxious feeling sometimes, not every day. She stated that the side effects are better than when she first started taking the medication. Patient also stated that taking Tamoxifen out ways the side effects, so she will like to continue taking it.

## 2017-11-24 NOTE — Telephone Encounter (Signed)
Good news.

## 2017-12-22 ENCOUNTER — Other Ambulatory Visit: Payer: Self-pay

## 2017-12-22 DIAGNOSIS — D0511 Intraductal carcinoma in situ of right breast: Secondary | ICD-10-CM

## 2018-01-05 ENCOUNTER — Telehealth: Payer: Self-pay

## 2018-01-05 NOTE — Telephone Encounter (Signed)
Message left for patient to call back to get mammogram date and time and to schedule her follow up with Dr Bary Castilla.

## 2018-01-19 ENCOUNTER — Ambulatory Visit
Admission: RE | Admit: 2018-01-19 | Discharge: 2018-01-19 | Disposition: A | Discharge status: Home / Self Care | Payer: Medicare Other | Source: Ambulatory Visit | Attending: General Surgery | Admitting: General Surgery

## 2018-01-19 DIAGNOSIS — D0511 Intraductal carcinoma in situ of right breast: Secondary | ICD-10-CM

## 2018-01-19 HISTORY — DX: Personal history of irradiation: Z92.3

## 2018-02-04 ENCOUNTER — Encounter: Payer: Self-pay | Admitting: General Surgery

## 2018-02-04 ENCOUNTER — Ambulatory Visit (INDEPENDENT_AMBULATORY_CARE_PROVIDER_SITE_OTHER): Payer: Medicare Other | Admitting: General Surgery

## 2018-02-04 ENCOUNTER — Telehealth: Payer: Self-pay | Admitting: *Deleted

## 2018-02-04 VITALS — BP 148/78 | HR 72 | Resp 14 | Ht 66.0 in | Wt 147.0 lb

## 2018-02-04 DIAGNOSIS — D0511 Intraductal carcinoma in situ of right breast: Secondary | ICD-10-CM

## 2018-02-04 DIAGNOSIS — M810 Age-related osteoporosis without current pathological fracture: Secondary | ICD-10-CM | POA: Insufficient documentation

## 2018-02-04 MED ORDER — LETROZOLE 2.5 MG PO TABS: 2.5000 mg | ORAL_TABLET | Freq: Every day | ORAL | 12 refills | Status: DC

## 2018-02-04 NOTE — Progress Notes (Signed)
Patient ID: Janice Riley, female   DOB: 1941/01/12, 77 y.o.   MRN: [id number]  Chief Complaint  Patient presents with  . Follow-up    HPI Janice Riley is a 77 y.o. female who presents for her right breast cancer follow up and a breast evaluation. The most recent mammogram was done on 01-19-18. No new breast issues..  Patient does perform regular self breast checks and gets regular mammograms done.   She stopped taking the Tamoxifen last week,  she was having leg cramps and personality changes.  HPI  Past Medical History:  Diagnosis Date  . Arthritis    FINGERS  . Breast cancer, stage 0, right 07/06/2017   High grade DCIS, lateral margin 86mm. ER/PR positive. Mammosite RX.   Marland Kitchen Dysrhythmia 05/2017   complete workup by Dr. Nehemiah Massed.  all was good  . Hypothyroidism   . Personal history of radiation therapy 2019   Right breast - mammotone rx  . Thyroid disease     Past Surgical History:  Procedure Laterality Date  . 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 BREAST LUMPECTOMY Right 06/2017   DCIS  . BREAST RECONSTRUCTION WITH MASTOPLASTY Right 07/06/2017   Procedure: BREAST RECONSTRUCTION WITH MASTOPLASTY;  Surgeon: Robert Bellow, MD;  Location: [facility] ORS;  Service: General;  Laterality: Right;  . COLONOSCOPY  2016   Dr Candace Cruise  . MASTECTOMY, PARTIAL Right 07/06/2017   Procedure: MASTECTOMY PARTIAL;  Surgeon: Robert Bellow, MD;  Location: [facility];  Service: General;  Laterality: Right;    Family History  Problem Relation Age of Onset  . Breast cancer Maternal Aunt     Social History Social History   Tobacco Use  . Smoking status: Never Smoker  . Smokeless tobacco: Never Used  Substance Use Topics  . Alcohol use: Yes    Comment: wine   . Drug use: No    No Known Allergies  Current Outpatient Medications  Medication Sig Dispense Refill  . alendronate (FOSAMAX) 70 MG tablet Take 70 mg by mouth once  weekly on Wednesday    . Cholecalciferol (VITAMIN D3) 2000 units capsule Take 2,000 Units by mouth daily.     Marland Kitchen levothyroxine (SYNTHROID, LEVOTHROID) 112 MCG tablet Take 112 mcg by mouth daily before breakfast.     . letrozole (FEMARA) 2.5 MG tablet Take 1 tablet (2.5 mg total) by mouth daily. 30 tablet 12   No current facility-administered medications for this visit.     Review of Systems Review of Systems  Constitutional: Negative.   Respiratory: Negative.   Cardiovascular: Negative.     Blood pressure (!) 148/78, pulse 72, resp. rate 14, height 5\' 6"  (1.676 m), weight 147 lb (66.7 kg).  Physical Exam Physical Exam  Constitutional: She is oriented to person, place, and time. She appears well-developed and well-nourished.  HENT:  Mouth/Throat: Oropharynx is clear and moist.  Eyes: Conjunctivae are normal. No scleral icterus.  Neck: Neck supple.  Cardiovascular: Normal rate, regular rhythm and normal heart sounds.  Pulmonary/Chest: Effort normal and breath sounds normal. Right breast exhibits no inverted nipple, no mass, no nipple discharge, no skin change and no tenderness. Left breast exhibits no inverted nipple, no mass, no nipple discharge, no skin change and no tenderness.  Right breast incision well healed.     Lymphadenopathy:    She has no cervical adenopathy.    She has no axillary adenopathy.  Neurological: She  is alert and oriented to person, place, and time.  Skin: Skin is warm and dry.  Psychiatric: Her behavior is normal.    Data Reviewed January 19, 2018 bilateral diagnostic mammograms reviewed.  No residual microcalcifications.  BI-RADS-2.   Bone density 01-31-16 at [facility]:  SITE DATE BMD g/cm2  T-SCORE Z-SCORE g/cm2 CHANGE % CHANGESTATISTICALLY SIGNIFICANT?  LUMBAR SPINE L1- L4   HIP L.FEM.NECK 01/13/13 0.568 -2.5    01/31/16 0.635 -1.9+0.067 +11.9% YES   L.TOTAL HIP 01/13/13 0.643 -2.5    01/31/16  0.687 -2.1+0.044 +6.9% YES  FOREARM  L/R 33%  01/31/16 0.645 -0.8   INTERPRETATION: Lumbar spine not reported due to scoliosis or sclerosis.  Osteopenia/Low bone mass .Interval increase of bone density from 12/2012.   Assessment    No evidence of recurrent DCIS.  Poor tolerance of tamoxifen due to mood swings.    Plan  She is willing to try Femara 2.5 mg po daily. She will call in one month with report if she is able to tolerate the new medication.   Patient to have a bilateral diagnostic mammogram follow up in 1 year. The patient is aware to call back for any questions or concerns.  The patient is due for a repeat bone density at this time.  Previously arranged by her PCP.  The potential for exacerbation of bone loss has been reviewed with the patient with the initiation of Femara therapy.  Present medication list does not include calcium supplements.  The patient will be contacted by phone to confirm that she is taking adequate calcium supplements.  1200 mg/day minimum.   HPI, Physical Exam, Assessment and Plan have been scribed under the direction and in the presence of Robert Bellow, MD. Janice Fetch, RN  I have completed the exam and reviewed the above documentation for accuracy and completeness.  I agree with the above.  Haematologist has been used and any errors in dictation or transcription are unintentional.  Janice Riley, M.D., F.A.C.S.   [name] Jennet Scroggin 02/04/2018, 9:23 AM

## 2018-02-04 NOTE — Patient Instructions (Addendum)
Patient to have a bilateral diagnostic mammogram follow up in year. The patient is aware to call back for any questions or concerns. She will call in one month with report if she is able to tolerate the new medication.

## 2018-02-04 NOTE — Telephone Encounter (Signed)
Notified patient as instructed, she states she is not currently taking calcium, but she will start back.

## 2018-02-04 NOTE — Telephone Encounter (Signed)
-----   Message from Robert Bellow, MD sent at 02/04/2018  9:29 AM EDT ----- Do not see calcium listed on the medication list for this patient.  Please contact her and confirm that she is or is not making use of at least 1200 mg of elemental calcium with vitamin D daily.  Thank you

## 2018-03-09 ENCOUNTER — Telehealth: Payer: Self-pay | Admitting: General Surgery

## 2018-03-09 NOTE — Telephone Encounter (Signed)
Patient has called with an update on the new medication Letrozole (Femara) she began taking on 02/04/18 prescribed by Dr Bary Castilla. She states, "I am so please and amazed with this new medication. I feel like myself".    Patient says the hot flashes and muscle cramps are much milder than before and very infrequent. She is most happy that her mental health has improved and she feels back to her normal self, without anxiety and mood swings.   Patient stated that if need be, please call her with any other questions. Phone number [phone].

## 2018-03-17 ENCOUNTER — Ambulatory Visit
Admission: RE | Admit: 2018-03-17 | Discharge: 2018-03-17 | Disposition: A | Discharge status: Home / Self Care | Payer: Medicare Other | Source: Ambulatory Visit | Attending: Radiation Oncology | Admitting: Radiation Oncology

## 2018-03-17 ENCOUNTER — Other Ambulatory Visit: Payer: Self-pay

## 2018-03-17 ENCOUNTER — Encounter: Payer: Self-pay | Admitting: Radiation Oncology

## 2018-03-17 DIAGNOSIS — D0511 Intraductal carcinoma in situ of right breast: Secondary | ICD-10-CM | POA: Diagnosis not present

## 2018-03-17 DIAGNOSIS — Z79811 Long term (current) use of aromatase inhibitors: Secondary | ICD-10-CM | POA: Insufficient documentation

## 2018-03-17 DIAGNOSIS — Z170 Estrogen receptor positive status [ER+]: Secondary | ICD-10-CM | POA: Diagnosis not present

## 2018-03-17 DIAGNOSIS — Z923 Personal history of irradiation: Secondary | ICD-10-CM | POA: Insufficient documentation

## 2018-03-17 NOTE — Progress Notes (Signed)
Radiation Oncology Follow up Note  Name: Janice Riley   Date:   03/17/2018 MRN:  [id number] DOB: December 07, 1940    This 77 y.o. female presents to the clinic today for  Month follow-up status post accelerated partial breast radiation to her right breast for ductal carcinoma in situ ER/PR positive.  REFERRING PROVIDER: Adin Hector, MD  HPI: patient is a 77 year old female now seen out 5 months having completed accelerated partial breast irradiation to her right breast for ER/PR positive ductal carcinoma in situ. Seen today in routine follow-up she is doing well. She specifically denies breast tenderness cough or bone pain. She's currently.on Femara tolerate that well without side effect.she had mammograms in September which I have reviewed were BI-RADS 2 benign.  COMPLICATIONS OF TREATMENT: none  FOLLOW UP COMPLIANCE: keeps appointments   PHYSICAL EXAM:  BP (!) (P) 141/79 (BP Location: Left Arm, Patient Position: Sitting)   Pulse (P) 79   Temp (!) (P) 96.6 F (35.9 C) (Tympanic)   Wt (P) 148 lb 13 oz (67.5 kg)   BMI (P) 24.02 kg/m  Lungs are clear to A&P cardiac examination essentially unremarkable with regular rate and rhythm. No dominant mass or nodularity is noted in either breast in 2 positions examined. Incision is well-healed. No axillary or supraclavicular adenopathy is appreciated. Cosmetic result is excellent.Well-developed well-nourished patient in NAD. HEENT reveals PERLA, EOMI, discs not visualized.  Oral cavity is clear. No oral mucosal lesions are identified. Neck is clear without evidence of cervical or supraclavicular adenopathy. Lungs are clear to A&P. Cardiac examination is essentially unremarkable with regular rate and rhythm without murmur rub or thrill. Abdomen is benign with no organomegaly or masses noted. Motor sensory and DTR levels are equal and symmetric in the upper and lower extremities. Cranial nerves II through XII are grossly intact. Proprioception is intact.  No peripheral adenopathy or edema is identified. No motor or sensory levels are noted. Crude visual fields are within normal range.  RADIOLOGY RESULTS: mammograms are reviewed and compatible with the above-stated findings  PLAN: at the present time patient is to do well with no evidence of disease. I'm please were overall progress. I have asked to see her back in 6 months for follow-up and then will start once your follow-up appointments. She continues on Femara without side effect. Patient is to call with any concerns.  I would like to take this opportunity to thank you for allowing me to participate in the care of your patient.Noreene Filbert, MD

## 2018-03-17 NOTE — Addendum Note (Signed)
Encounter addended by: Noreene Filbert, MD on: 03/17/2018 4:05 PM  Actions taken: LOS modified

## 2018-09-23 ENCOUNTER — Encounter: Payer: Self-pay | Admitting: Radiation Oncology

## 2018-09-23 ENCOUNTER — Encounter (INDEPENDENT_AMBULATORY_CARE_PROVIDER_SITE_OTHER): Payer: Self-pay

## 2018-09-23 ENCOUNTER — Ambulatory Visit
Admission: RE | Admit: 2018-09-23 | Discharge: 2018-09-23 | Disposition: A | Discharge status: Home / Self Care | Payer: Medicare Other | Source: Ambulatory Visit | Attending: Radiation Oncology | Admitting: Radiation Oncology

## 2018-09-23 ENCOUNTER — Other Ambulatory Visit: Payer: Self-pay | Admitting: *Deleted

## 2018-09-23 ENCOUNTER — Other Ambulatory Visit: Payer: Self-pay

## 2018-09-23 VITALS — BP 152/82 | HR 82 | Temp 97.8°F | Resp 18 | Wt 148.5 lb

## 2018-09-23 DIAGNOSIS — Z79811 Long term (current) use of aromatase inhibitors: Secondary | ICD-10-CM | POA: Diagnosis not present

## 2018-09-23 DIAGNOSIS — D0511 Intraductal carcinoma in situ of right breast: Secondary | ICD-10-CM | POA: Diagnosis not present

## 2018-09-23 DIAGNOSIS — Z923 Personal history of irradiation: Secondary | ICD-10-CM | POA: Insufficient documentation

## 2018-09-23 DIAGNOSIS — Z170 Estrogen receptor positive status [ER+]: Secondary | ICD-10-CM | POA: Diagnosis not present

## 2018-09-23 NOTE — Progress Notes (Signed)
Radiation Oncology Follow up Note  Name: Janice Riley   Date:   09/23/2018 MRN:  [id number] DOB: [birth year]    This 78 y.o. female presents to the clinic today for 1 year follow-up status post accelerated partial breast irradiation to her right breast for ER PR positive ductal carcinoma in situ.  REFERRING PROVIDER: Adin Hector, MD  HPI: Patient is an 78 year old female now out 1 year having completed accelerated partial breast irradiation to her right breast for ER PR positive ductal carcinoma in situ.  She is seen today in routine follow-up and is doing well.  She specifically denies breast tenderness cough or bone pain.  Unfortunately she is not had a mammogram scheduled nor has she seen Dr. Tollie Pizza for follow-up for unexplained reasons..  Her last mammogram was back in September which I have reviewed.  Patient is currently on letrozole tolerating that well without side effect.  COMPLICATIONS OF TREATMENT: none  FOLLOW UP COMPLIANCE: keeps appointments   PHYSICAL EXAM:  BP (!) 152/82   Pulse 82   Temp 97.8 F (36.6 C)   Resp 18   Wt 148 lb 7.7 oz (67.4 kg)   BMI 23.97 kg/m  Lungs are clear to A&P cardiac examination essentially unremarkable with regular rate and rhythm. No dominant mass or nodularity is noted in either breast in 2 positions examined. Incision is well-healed. No axillary or supraclavicular adenopathy is appreciated. Cosmetic result is excellent.  Well-developed well-nourished patient in NAD. HEENT reveals PERLA, EOMI, discs not visualized.  Oral cavity is clear. No oral mucosal lesions are identified. Neck is clear without evidence of cervical or supraclavicular adenopathy. Lungs are clear to A&P. Cardiac examination is essentially unremarkable with regular rate and rhythm without murmur rub or thrill. Abdomen is benign with no organomegaly or masses noted. Motor sensory and DTR levels are equal and symmetric in the upper and lower extremities. Cranial nerves II  through XII are grossly intact. Proprioception is intact. No peripheral adenopathy or edema is identified. No motor or sensory levels are noted. Crude visual fields are within normal range.  RADIOLOGY RESULTS: Mammograms from September are reviewed and compatible with above-stated findings  PLAN: Present time she is doing well with no evidence of disease 1 year out.  I am having diagnostic mammograms performed although we can push that out several months since her last ones were in September.  I have also asked the patient to make a follow-up appoint with Dr. Tollie Pizza.  Patient continues on letrozole without side effect.  I have asked to see her back in 1 year.  Patient is to call with any concerns.  I would like to take this opportunity to thank you for allowing me to participate in the care of your patient.Noreene Filbert, MD

## 2018-12-07 ENCOUNTER — Encounter: Payer: Self-pay | Admitting: General Surgery

## 2018-12-13 ENCOUNTER — Telehealth: Payer: Self-pay | Admitting: General Practice

## 2018-12-13 NOTE — Telephone Encounter (Signed)
Patient called said she did not want to schedule at this time with one of the providers, but was told she could call anytime to schedule with one of the providers here in our office.

## 2018-12-21 ENCOUNTER — Other Ambulatory Visit: Payer: Self-pay

## 2018-12-21 DIAGNOSIS — D0511 Intraductal carcinoma in situ of right breast: Secondary | ICD-10-CM

## 2019-01-21 ENCOUNTER — Ambulatory Visit
Admission: RE | Admit: 2019-01-21 | Discharge: 2019-01-21 | Disposition: A | Discharge status: Home / Self Care | Payer: Medicare Other | Source: Ambulatory Visit | Attending: Surgery | Admitting: Surgery

## 2019-01-21 DIAGNOSIS — D0511 Intraductal carcinoma in situ of right breast: Secondary | ICD-10-CM | POA: Insufficient documentation

## 2019-01-25 ENCOUNTER — Telehealth: Payer: Self-pay

## 2019-01-25 NOTE — Telephone Encounter (Signed)
Patient notified that her mammogram was normal per Dr Dahlia Byes. She says that she will call back later to reschedule her follow up with him.

## 2019-01-31 ENCOUNTER — Ambulatory Visit: Payer: Medicare Other | Admitting: Surgery

## 2019-03-09 ENCOUNTER — Encounter: Payer: Self-pay | Admitting: *Deleted

## 2019-09-29 ENCOUNTER — Ambulatory Visit: Admission: RE | Admit: 2019-09-29 | Payer: Medicare PPO | Source: Ambulatory Visit | Admitting: Radiation Oncology

## 2019-12-20 ENCOUNTER — Other Ambulatory Visit: Payer: Self-pay | Admitting: General Surgery

## 2019-12-20 DIAGNOSIS — D0511 Intraductal carcinoma in situ of right breast: Secondary | ICD-10-CM

## 2020-01-25 ENCOUNTER — Ambulatory Visit
Admission: RE | Admit: 2020-01-25 | Discharge: 2020-01-25 | Disposition: A | Discharge status: Home / Self Care | Payer: Medicare PPO | Source: Ambulatory Visit | Attending: General Surgery | Admitting: General Surgery

## 2020-01-25 ENCOUNTER — Other Ambulatory Visit: Payer: Self-pay

## 2020-01-25 DIAGNOSIS — D0511 Intraductal carcinoma in situ of right breast: Secondary | ICD-10-CM

## 2020-02-02 ENCOUNTER — Other Ambulatory Visit (HOSPITAL_COMMUNITY): Payer: Self-pay | Admitting: Internal Medicine

## 2020-02-02 ENCOUNTER — Other Ambulatory Visit: Payer: Self-pay | Admitting: Internal Medicine

## 2020-02-02 DIAGNOSIS — S22000S Wedge compression fracture of unspecified thoracic vertebra, sequela: Secondary | ICD-10-CM

## 2020-02-08 ENCOUNTER — Ambulatory Visit: Payer: Medicare PPO | Admitting: Dermatology

## 2020-02-08 ENCOUNTER — Other Ambulatory Visit: Payer: Self-pay

## 2020-02-08 DIAGNOSIS — D485 Neoplasm of uncertain behavior of skin: Secondary | ICD-10-CM

## 2020-02-08 DIAGNOSIS — L578 Other skin changes due to chronic exposure to nonionizing radiation: Secondary | ICD-10-CM | POA: Diagnosis not present

## 2020-02-08 DIAGNOSIS — C44729 Squamous cell carcinoma of skin of left lower limb, including hip: Secondary | ICD-10-CM | POA: Diagnosis not present

## 2020-02-08 DIAGNOSIS — L719 Rosacea, unspecified: Secondary | ICD-10-CM

## 2020-02-08 DIAGNOSIS — C4492 Squamous cell carcinoma of skin, unspecified: Secondary | ICD-10-CM

## 2020-02-08 HISTORY — DX: Squamous cell carcinoma of skin, unspecified: C44.92

## 2020-02-08 MED ORDER — MUPIROCIN 2 % EX OINT: 1.0000 "application " | TOPICAL_OINTMENT | Freq: Every day | CUTANEOUS | 0 refills | Status: DC

## 2020-02-08 NOTE — Progress Notes (Signed)
O moyamoya call  New Patient Visit  Subjective  Janice Riley is a 79 y.o. female who presents for the following: Lesion   She reports a spot that came up at left leg below knee. It has been present for 6-8 weeks. It is not sore, and she has not used anything to treat it.  Patient does not have a history of skin cancer.   The following portions of the chart were reviewed this encounter and updated as appropriate:  Tobacco  Allergies  Meds  Problems  Med Hx  Surg Hx  Fam Hx      Review of Systems:  No other skin or systemic complaints except as noted in HPI or Assessment and Plan.  Objective  Well appearing patient in no apparent distress; mood and affect are within normal limits.  A focused examination was performed including face, eyelids, lips, neck, hands, arms, left leg. Relevant physical exam findings are noted in the Assessment and Plan.  Objective  Left pretibia: 1.4cm pink nodule       Objective  Face: Erythema    Assessment & Plan  Neoplasm of uncertain behavior of skin Left pretibia  Skin / nail biopsy Type of biopsy: tangential   Informed consent: discussed and consent obtained   Timeout: patient name, date of birth, surgical site, and procedure verified   Patient was prepped and draped in usual sterile fashion: Area prepped with isopropyl alcohol. Anesthesia: the lesion was anesthetized in a standard fashion   Anesthetic:  1% lidocaine w/ epinephrine 1-100,000 buffered w/ 8.4% NaHCO3 Instrument used: flexible razor blade   Hemostasis achieved with: aluminum chloride   Outcome: patient tolerated procedure well   Post-procedure details: wound care instructions given   Additional details:  Mupirocin and a bandage applied  Destruction of lesion Complexity: extensive   Destruction method: electrodesiccation and curettage   Informed consent: discussed and consent obtained   Timeout:  patient name, date of birth, surgical site, and procedure  verified Patient was prepped and draped in usual sterile fashion: area prepped with isopropyl alcohol. Anesthesia: the lesion was anesthetized in a standard fashion   Anesthetic:  1% lidocaine w/ epinephrine 1-100,000 buffered w/ 8.4% NaHCO3 Curettage performed in three different directions: Yes   Electrodesiccation performed over the curetted area: Yes   Curettage cycles:  3 Lesion length (cm):  1.4 Lesion width (cm):  1.4 Margin per side (cm):  0.2 Final wound size (cm):  1.8 Hemostasis achieved with:  electrodesiccation Outcome: patient tolerated procedure well with no complications   Post-procedure details: wound care instructions given   Additional details:  Mupirocin and a pressure dressing applied  mupirocin ointment (BACTROBAN) 2 %  Specimen 1 - Surgical pathology Differential Diagnosis: r/o SCC KAC type Check Margins: No 1.4cm pink nodule  No lymphadenopathy.   Start mupirocin daily with dressing changes. Recommend chlorhexadine wash once daily.   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  Actinic Damage - diffuse scaly erythematous macules with underlying dyspigmentation - Recommend daily broad spectrum sunscreen SPF 30+ to sun-exposed areas, reapply every 2 hours as needed.  - Call for new or changing lesions.  Return for TBSE.  Graciella Belton, RMA, am acting as scribe for [name], MD .  Documentation: I have reviewed the above documentation for accuracy and completeness, and I agree with the above.  [name], MD

## 2020-02-08 NOTE — Patient Instructions (Addendum)
Wound Care Instructions  1. Cleanse wound gently with soap and water once a day then pat dry with clean gauze. Apply a thing coat of Petrolatum (petroleum jelly, "Vaseline") over the wound (unless you have an allergy to this). We recommend that you use a new, sterile tube of Vaseline. Do not pick or remove scabs. Do not remove the yellow or white "healing tissue" from the base of the wound.  2. Cover the wound with fresh, clean, nonstick gauze and secure with paper tape. You may use Band-Aids in place of gauze and tape if the would is small enough, but would recommend trimming much of the tape off as there is often too much. Sometimes Band-Aids can irritate the skin.  3. You should call the office for your biopsy report after 1 week if you have not already been contacted.  4. If you experience any problems, such as abnormal amounts of bleeding, swelling, significant bruising, significant pain, or evidence of infection, please call the office immediately.  5. FOR ADULT SURGERY PATIENTS: If you need something for pain relief you may take 1 extra strength Tylenol (acetaminophen) AND 2 Ibuprofen (200mg  each) together every 4 hours as needed for pain. (do not take these if you are allergic to them or if you have a reason you should not take them.) Typically, you may only need pain medication for 1 to 3 days.   Recommend daily broad spectrum sunscreen SPF 30+ to sun-exposed areas, reapply every 2 hours as needed. Call for new or changing lesions.  Wash area with chlorhexadine (over the counter) then apply mupirocin ointment (prescription) and cover with bandaid.

## 2020-02-13 ENCOUNTER — Encounter: Payer: Self-pay | Admitting: Dermatology

## 2020-02-14 NOTE — Progress Notes (Signed)
Skin (M), left pretibia SQUAMOUS CELL CARCINOMA, KERATOACANTHOMA TYPE, already treated with ED&C.

## 2020-02-15 ENCOUNTER — Telehealth: Payer: Self-pay

## 2020-02-15 NOTE — Telephone Encounter (Signed)
-----   Message from Alfonso Patten, MD sent at 02/14/2020  9:17 PM EDT ----- Skin (M), left pretibia SQUAMOUS CELL CARCINOMA, KERATOACANTHOMA TYPE, already treated with ED&C.

## 2020-02-15 NOTE — Telephone Encounter (Signed)
Spoke with patient regarding bx results showing SCC already treated with EDC, she had no questions, JS

## 2020-02-27 ENCOUNTER — Ambulatory Visit
Admission: RE | Admit: 2020-02-27 | Discharge: 2020-02-27 | Disposition: A | Discharge status: Home / Self Care | Payer: Medicare PPO | Source: Ambulatory Visit | Attending: Internal Medicine | Admitting: Internal Medicine

## 2020-02-27 ENCOUNTER — Other Ambulatory Visit: Payer: Self-pay

## 2020-02-27 DIAGNOSIS — S22000S Wedge compression fracture of unspecified thoracic vertebra, sequela: Secondary | ICD-10-CM | POA: Insufficient documentation

## 2020-02-29 ENCOUNTER — Ambulatory Visit: Payer: Medicare PPO

## 2020-04-04 ENCOUNTER — Other Ambulatory Visit: Payer: Self-pay | Admitting: Neurosurgery

## 2020-04-09 ENCOUNTER — Encounter
Admission: RE | Admit: 2020-04-09 | Discharge: 2020-04-09 | Disposition: A | Discharge status: Home / Self Care | Payer: Medicare PPO | Source: Ambulatory Visit | Attending: Neurosurgery | Admitting: Neurosurgery

## 2020-04-09 ENCOUNTER — Other Ambulatory Visit: Payer: Self-pay

## 2020-04-09 DIAGNOSIS — I10 Essential (primary) hypertension: Secondary | ICD-10-CM | POA: Insufficient documentation

## 2020-04-09 DIAGNOSIS — Z20822 Contact with and (suspected) exposure to covid-19: Secondary | ICD-10-CM | POA: Insufficient documentation

## 2020-04-09 DIAGNOSIS — Z01818 Encounter for other preprocedural examination: Secondary | ICD-10-CM | POA: Insufficient documentation

## 2020-04-09 LAB — BASIC METABOLIC PANEL
Anion gap: 7 (ref 5–15)
BUN: 17 mg/dL (ref 8–23)
CO2: 23 mmol/L (ref 22–32)
Calcium: 8.2 mg/dL — ABNORMAL LOW (ref 8.9–10.3)
Chloride: 107 mmol/L (ref 98–111)
Creatinine, Ser: 0.98 mg/dL (ref 0.44–1.00)
GFR, Estimated: 58 mL/min — ABNORMAL LOW (ref >60)
Glucose, Bld: 89 mg/dL (ref 70–99)
Potassium: 3.2 mmol/L — ABNORMAL LOW (ref 3.5–5.1)
Sodium: 137 mmol/L (ref 135–145)

## 2020-04-09 LAB — URINALYSIS, ROUTINE W REFLEX MICROSCOPIC
Bilirubin Urine: NEGATIVE
Glucose, UA: NEGATIVE mg/dL
Hgb urine dipstick: NEGATIVE
Ketones, ur: 20 mg/dL — AB
Leukocytes,Ua: NEGATIVE
Nitrite: POSITIVE — AB
Protein, ur: NEGATIVE mg/dL
Specific Gravity, Urine: 1.014 (ref 1.005–1.030)
pH: 6 (ref 5.0–8.0)

## 2020-04-09 LAB — SURGICAL PCR SCREEN
MRSA, PCR: NEGATIVE
Staphylococcus aureus: NEGATIVE

## 2020-04-09 LAB — PROTIME-INR
INR: 0.9 (ref 0.8–1.2)
Prothrombin Time: 12.1 seconds (ref 11.4–15.2)

## 2020-04-09 LAB — TYPE AND SCREEN
ABO/RH(D): O POS
Antibody Screen: NEGATIVE

## 2020-04-09 LAB — CBC
HCT: 36.9 % (ref 36.0–46.0)
Hemoglobin: 11.9 g/dL — ABNORMAL LOW (ref 12.0–15.0)
MCH: 30 pg (ref 26.0–34.0)
MCHC: 32.2 g/dL (ref 30.0–36.0)
MCV: 92.9 fL (ref 80.0–100.0)
Platelets: 280 10*3/uL (ref 150–400)
RBC: 3.97 MIL/uL (ref 3.87–5.11)
RDW: 14.5 % (ref 11.5–15.5)
WBC: 7.2 10*3/uL (ref 4.0–10.5)
nRBC: 0 % (ref 0.0–0.2)

## 2020-04-09 LAB — SARS CORONAVIRUS 2 (TAT 6-24 HRS): SARS Coronavirus 2: NEGATIVE

## 2020-04-09 LAB — APTT: aPTT: 34 seconds (ref 24–36)

## 2020-04-09 NOTE — Patient Instructions (Signed)
Your procedure is scheduled on: Wednesday April 11, 2020. Report to Day Surgery inside [facility] 2nd floor (stop by Registration Desk). To find out your arrival time please call [phone] between 1PM - 3PM on Tuesday April 10, 2020.  Remember: Instructions that are not followed completely may result in serious medical risk,  up to and including death, or upon the discretion of your surgeon and anesthesiologist your  surgery may need to be rescheduled.     _X__ 1. Do not eat food after midnight the night before your procedure.                 No chewing gum or hard candies. You may drink clear liquids up to 2 hours                 before you are scheduled to arrive for your surgery- DO not drink clear                 liquids within 2 hours of the start of your surgery.                 Clear Liquids include:  water, apple juice without pulp, clear Gatorade, G2 or                  Gatorade Zero (avoid Red/Purple/Blue), Black Coffee or Tea (Do not add                 anything to coffee or tea).  __X__2.  On the morning of surgery brush your teeth with toothpaste and water, you                may rinse your mouth with mouthwash if you wish.  Do not swallow any toothpaste of mouthwash.     _X__ 3.  No Alcohol for 24 hours before or after surgery.   _X__ 4.  Do Not Smoke or use e-cigarettes For 24 Hours Prior to Your Surgery.                 Do not use any chewable tobacco products for at least 6 hours prior to                 Surgery.  _X__  5.  Do not use any recreational drugs (marijuana, cocaine, heroin, ecstasy, MDMA or other)                For at least one week prior to your surgery.  Combination of these drugs with anesthesia                May have life threatening results.  __X__6.  Notify your doctor if there is any change in your medical condition      (cold, fever, infections).     Do not wear jewelry, make-up, hairpins, clips or nail  polish. Do not wear lotions, powders, or perfumes. You may wear deodorant. Do not shave 48 hours prior to surgery. Men may shave face and neck. Do not bring valuables to the hospital.    [facility] is not responsible for any belongings or valuables.  Contacts, dentures or bridgework may not be worn into surgery. Leave your suitcase in the car. After surgery it may be brought to your room. For patients admitted to the hospital, discharge time is determined by your treatment team.   Patients discharged the day of surgery will not be allowed to drive home.   Make arrangements for  someone to be with you for the first 24 hours of your Same Day Discharge.   __X__ Take these medicines the morning of surgery with A SIP OF WATER:    1. levothyroxine (SYNTHROID) 100 MCG  ____ Fleet Enema (as directed)   __X__ Use CHG Soap (or wipes) as directed  ____ Use Benzoyl Peroxide Gel as instructed  ____ Use inhalers on the day of surgery  ____ Stop metformin 2 days prior to surgery    ____ Take 1/2 of usual insulin dose the night before surgery. No insulin the morning          of surgery.   ____ Stop Coumadin/Plavix/aspirin   __X__ Stop Anti-inflammatories such as ibuprofen (ADVIL), Aleve, naproxen, aspirin and or BC powders.    __X__ Stop supplements until after surgery.    __X__ Do not start any herbal supplements before your surgery.    If you have any questions regarding your pre-procedure instructions,  Please call Pre-admit Testing at [phone].

## 2020-04-10 ENCOUNTER — Encounter: Payer: Medicare PPO | Admitting: Dermatology

## 2020-04-10 NOTE — Progress Notes (Addendum)
  [facility] Perioperative Services: Pre-Admission/Anesthesia Testing  Abnormal Lab Notification   Date: 04/10/20  Name: Skylan Gift MRN:   [id number]  Re: Abnormal labs noted during PAT appointment   Provider(s) Notified: Meade Maw, MD Notification mode: Routed and/or faxed via [facility]   ABNORMAL LAB VALUE(S): Lab Results  Component Value Date   COLORURINE YELLOW (A) 04/09/2020   APPEARANCEUR CLOUDY (A) 04/09/2020   LABSPEC 1.014 04/09/2020   PHURINE 6.0 04/09/2020   GLUCOSEU NEGATIVE 04/09/2020   HGBUR NEGATIVE 04/09/2020   BILIRUBINUR NEGATIVE 04/09/2020   KETONESUR 20 (A) 04/09/2020   PROTEINUR NEGATIVE 04/09/2020   NITRITE POSITIVE (A) 04/09/2020   LEUKOCYTESUR NEGATIVE 04/09/2020   EPIU 11-20 04/09/2020   WBCU 6-10 04/09/2020   RBCU 0-5 04/09/2020   BACTERIA MANY (A) 04/09/2020   Notes:  Patient is scheduled for a LEFT L4-5 SYNOVIAL CYST RESECTION (N/A ) on 04/11/2020.  UA performed in PAT concerning for potential early urinary tract infection.   . No leukocytosis noted on CBC  . Renal function normal. Estimated Creatinine Clearance: 40.9 mL/min (by C-G formula based on SCr of 0.98 mg/dL).   . Urine C&S added to assess for pathogenically significant growth. - Empiric treatment deferred pending culture as sample is contaminated (11-20 squamous epithelial cells/LPF)  Patient will receive cefazolin dose prior to her surgery tomorrow. Will forward UA to attending surgeon for review and further Tx as deemed appropriate. Could consider re-collecting prior to her procedure if symptomatic.    ADDENDUM - 04/10/2020 @ 1000 AM Received communication from Dr. Rhea Bleacher nurse advising that MD would like to add additional antimicrobial coverage to preoperative prophylactic regimen tomorrow. MD asking that order by placed for GENTAMICIN 180 mg IV on call to the OR; dose clarified with MD who confirmed. Order placed per MD request by this APP.    Honor Loh, MSN, APRN, FNP-C, CEN [facility]  Peri-operative Services Nurse Practitioner Phone: [phone] Fax: [phone] 04/10/20 7:43 AM

## 2020-04-11 ENCOUNTER — Inpatient Hospital Stay: Payer: Medicare PPO | Admitting: Urgent Care

## 2020-04-11 ENCOUNTER — Inpatient Hospital Stay: Payer: Medicare PPO

## 2020-04-11 ENCOUNTER — Other Ambulatory Visit: Payer: Self-pay

## 2020-04-11 ENCOUNTER — Encounter
Admission: RE | Disposition: A | Discharge status: Home / Self Care | Payer: Self-pay | Source: Home / Self Care | Attending: Neurosurgery

## 2020-04-11 ENCOUNTER — Inpatient Hospital Stay
Admission: RE | Admit: 2020-04-11 | Discharge: 2020-04-11 | DRG: 518 | Disposition: A | Discharge status: Home / Self Care | Payer: Medicare PPO | Attending: Neurosurgery | Admitting: Neurosurgery

## 2020-04-11 ENCOUNTER — Encounter: Payer: Self-pay | Admitting: Neurosurgery

## 2020-04-11 DIAGNOSIS — Z853 Personal history of malignant neoplasm of breast: Secondary | ICD-10-CM

## 2020-04-11 DIAGNOSIS — Z85828 Personal history of other malignant neoplasm of skin: Secondary | ICD-10-CM | POA: Diagnosis not present

## 2020-04-11 DIAGNOSIS — M5416 Radiculopathy, lumbar region: Secondary | ICD-10-CM | POA: Diagnosis present

## 2020-04-11 DIAGNOSIS — M7138 Other bursal cyst, other site: Secondary | ICD-10-CM | POA: Diagnosis present

## 2020-04-11 DIAGNOSIS — Z20822 Contact with and (suspected) exposure to covid-19: Secondary | ICD-10-CM | POA: Diagnosis present

## 2020-04-11 DIAGNOSIS — E039 Hypothyroidism, unspecified: Secondary | ICD-10-CM | POA: Diagnosis present

## 2020-04-11 DIAGNOSIS — Z170 Estrogen receptor positive status [ER+]: Secondary | ICD-10-CM

## 2020-04-11 DIAGNOSIS — Z419 Encounter for procedure for purposes other than remedying health state, unspecified: Secondary | ICD-10-CM

## 2020-04-11 DIAGNOSIS — M810 Age-related osteoporosis without current pathological fracture: Secondary | ICD-10-CM | POA: Diagnosis present

## 2020-04-11 DIAGNOSIS — Z9010 Acquired absence of unspecified breast and nipple: Secondary | ICD-10-CM | POA: Diagnosis not present

## 2020-04-11 DIAGNOSIS — M1990 Unspecified osteoarthritis, unspecified site: Secondary | ICD-10-CM | POA: Diagnosis present

## 2020-04-11 DIAGNOSIS — Z923 Personal history of irradiation: Secondary | ICD-10-CM | POA: Diagnosis not present

## 2020-04-11 HISTORY — PX: LAMINECTOMY: SHX219

## 2020-04-11 SURGERY — LUMBAR LAMINECTOMY FOR TUMOR
Anesthesia: General

## 2020-04-11 MED ORDER — SUCCINYLCHOLINE CHLORIDE 200 MG/10ML IV SOSY
PREFILLED_SYRINGE | INTRAVENOUS | Status: AC
  Filled 2020-04-11: qty 10

## 2020-04-11 MED ORDER — DEXAMETHASONE SODIUM PHOSPHATE 10 MG/ML IJ SOLN
INTRAMUSCULAR | Status: AC
  Filled 2020-04-11: qty 1

## 2020-04-11 MED ORDER — ORAL CARE MOUTH RINSE: 15.0000 mL | Freq: Once | OROMUCOSAL | Status: AC

## 2020-04-11 MED ORDER — THROMBIN 5000 UNITS EX SOLR
CUTANEOUS | Status: DC | PRN
  Administered 2020-04-11: 5000 [IU] via TOPICAL

## 2020-04-11 MED ORDER — CHLORHEXIDINE GLUCONATE 0.12 % MT SOLN
OROMUCOSAL | Status: AC
  Administered 2020-04-11: 15 mL via OROMUCOSAL
  Filled 2020-04-11: qty 15

## 2020-04-11 MED ORDER — EPHEDRINE 5 MG/ML INJ
INTRAVENOUS | Status: AC
  Filled 2020-04-11: qty 10

## 2020-04-11 MED ORDER — DEXAMETHASONE SODIUM PHOSPHATE 10 MG/ML IJ SOLN
INTRAMUSCULAR | Status: DC | PRN
  Administered 2020-04-11: 10 mg via INTRAVENOUS

## 2020-04-11 MED ORDER — SODIUM CHLORIDE 0.9 % IV SOLN
INTRAVENOUS | Status: DC | PRN
  Administered 2020-04-11: 50 ug/min via INTRAVENOUS

## 2020-04-11 MED ORDER — ONDANSETRON HCL 4 MG/2ML IJ SOLN
INTRAMUSCULAR | Status: AC
  Filled 2020-04-11: qty 2

## 2020-04-11 MED ORDER — CEFAZOLIN SODIUM-DEXTROSE 2-4 GM/100ML-% IV SOLN
2.0000 g | Freq: Once | INTRAVENOUS | Status: AC
  Administered 2020-04-11: 2 g via INTRAVENOUS

## 2020-04-11 MED ORDER — ACETAMINOPHEN 10 MG/ML IV SOLN
INTRAVENOUS | Status: AC
  Filled 2020-04-11: qty 100

## 2020-04-11 MED ORDER — LIDOCAINE HCL (PF) 2 % IJ SOLN
INTRAMUSCULAR | Status: AC
  Filled 2020-04-11: qty 5

## 2020-04-11 MED ORDER — METHYLPREDNISOLONE ACETATE 40 MG/ML IJ SUSP
INTRAMUSCULAR | Status: DC | PRN
  Administered 2020-04-11: 40 mg

## 2020-04-11 MED ORDER — PROPOFOL 10 MG/ML IV BOLUS
INTRAVENOUS | Status: AC
  Filled 2020-04-11: qty 20

## 2020-04-11 MED ORDER — ACETAMINOPHEN 10 MG/ML IV SOLN
INTRAVENOUS | Status: DC | PRN
  Administered 2020-04-11: 1000 mg via INTRAVENOUS

## 2020-04-11 MED ORDER — GLYCOPYRROLATE 0.2 MG/ML IJ SOLN
INTRAMUSCULAR | Status: AC
  Filled 2020-04-11: qty 1

## 2020-04-11 MED ORDER — SODIUM CHLORIDE 0.9 % IV SOLN: INTRAVENOUS | Status: DC

## 2020-04-11 MED ORDER — EPHEDRINE SULFATE 50 MG/ML IJ SOLN
INTRAMUSCULAR | Status: DC | PRN
  Administered 2020-04-11: 10 mg via INTRAVENOUS

## 2020-04-11 MED ORDER — REMIFENTANIL HCL 1 MG IV SOLR
INTRAVENOUS | Status: AC
  Filled 2020-04-11: qty 1000

## 2020-04-11 MED ORDER — SUCCINYLCHOLINE CHLORIDE 20 MG/ML IJ SOLN
INTRAMUSCULAR | Status: DC | PRN
  Administered 2020-04-11: 80 mg via INTRAVENOUS

## 2020-04-11 MED ORDER — FAMOTIDINE 20 MG PO TABS: 20.0000 mg | ORAL_TABLET | Freq: Once | ORAL | Status: AC

## 2020-04-11 MED ORDER — FAMOTIDINE 20 MG PO TABS
ORAL_TABLET | ORAL | Status: AC
  Administered 2020-04-11: 20 mg via ORAL
  Filled 2020-04-11: qty 1

## 2020-04-11 MED ORDER — GLYCOPYRROLATE 0.2 MG/ML IJ SOLN
INTRAMUSCULAR | Status: DC | PRN
  Administered 2020-04-11: .2 mg via INTRAVENOUS

## 2020-04-11 MED ORDER — FENTANYL CITRATE (PF) 100 MCG/2ML IJ SOLN
INTRAMUSCULAR | Status: DC | PRN
  Administered 2020-04-11: 50 ug via INTRAVENOUS
  Administered 2020-04-11 (×2): 25 ug via INTRAVENOUS

## 2020-04-11 MED ORDER — FENTANYL CITRATE (PF) 100 MCG/2ML IJ SOLN: 25.0000 ug | INTRAMUSCULAR | Status: DC | PRN

## 2020-04-11 MED ORDER — CEFAZOLIN SODIUM-DEXTROSE 2-4 GM/100ML-% IV SOLN
INTRAVENOUS | Status: AC
  Filled 2020-04-11: qty 100

## 2020-04-11 MED ORDER — PHENYLEPHRINE HCL (PRESSORS) 10 MG/ML IV SOLN
INTRAVENOUS | Status: DC | PRN
  Administered 2020-04-11: 50 ug via INTRAVENOUS

## 2020-04-11 MED ORDER — PROPOFOL 10 MG/ML IV BOLUS
INTRAVENOUS | Status: DC | PRN
  Administered 2020-04-11: 100 mg via INTRAVENOUS

## 2020-04-11 MED ORDER — SODIUM CHLORIDE 0.9 % IV SOLN
INTRAVENOUS | Status: DC | PRN
  Administered 2020-04-11: 40 mL

## 2020-04-11 MED ORDER — FENTANYL CITRATE (PF) 100 MCG/2ML IJ SOLN
INTRAMUSCULAR | Status: AC
  Filled 2020-04-11: qty 2

## 2020-04-11 MED ORDER — BUPIVACAINE HCL 0.5 % IJ SOLN
INTRAMUSCULAR | Status: DC | PRN
  Administered 2020-04-11: 20 mL

## 2020-04-11 MED ORDER — REMIFENTANIL HCL 1 MG IV SOLR
INTRAVENOUS | Status: DC | PRN
Start: 2020-04-11 — End: 2020-04-11
  Administered 2020-04-11: .1 ug/kg/min via INTRAVENOUS

## 2020-04-11 MED ORDER — OXYCODONE HCL 5 MG PO TABS
5.0000 mg | ORAL_TABLET | Freq: Four times a day (QID) | ORAL | 0 refills | Status: DC | PRN
Start: 2020-04-11 — End: 2020-04-17

## 2020-04-11 MED ORDER — LIDOCAINE HCL (CARDIAC) PF 100 MG/5ML IV SOSY
PREFILLED_SYRINGE | INTRAVENOUS | Status: DC | PRN
  Administered 2020-04-11: 60 mg via INTRAVENOUS

## 2020-04-11 MED ORDER — ONDANSETRON HCL 4 MG/2ML IJ SOLN
INTRAMUSCULAR | Status: DC | PRN
  Administered 2020-04-11: 4 mg via INTRAVENOUS

## 2020-04-11 MED ORDER — ONDANSETRON HCL 4 MG/2ML IJ SOLN: 4.0000 mg | Freq: Once | INTRAMUSCULAR | Status: DC | PRN

## 2020-04-11 MED ORDER — FIBRIN SEALANT 2 ML SINGLE DOSE KIT
PACK | CUTANEOUS | Status: DC | PRN
  Administered 2020-04-11: 2 mL via TOPICAL

## 2020-04-11 MED ORDER — GENTAMICIN SULFATE 40 MG/ML IJ SOLN
180.0000 mg | Freq: Once | INTRAVENOUS | Status: AC
  Administered 2020-04-11: 180 mg via INTRAVENOUS
  Filled 2020-04-11: qty 4.5

## 2020-04-11 MED ORDER — BUPIVACAINE-EPINEPHRINE (PF) 0.5% -1:200000 IJ SOLN
INTRAMUSCULAR | Status: DC | PRN
  Administered 2020-04-11: 3 mL

## 2020-04-11 MED ORDER — CHLORHEXIDINE GLUCONATE 0.12 % MT SOLN: 15.0000 mL | Freq: Once | OROMUCOSAL | Status: AC

## 2020-04-11 MED ORDER — METHOCARBAMOL 500 MG PO TABS: 500.0000 mg | ORAL_TABLET | Freq: Four times a day (QID) | ORAL | 0 refills | Status: AC | PRN

## 2020-04-11 SURGICAL SUPPLY — 64 items
ADH SKN CLS APL DERMABOND .7 (GAUZE/BANDAGES/DRESSINGS) ×1
AGENT HMST MTR 8 SURGIFLO (HEMOSTASIS) ×1
APL PRP STRL LF DISP 70% ISPRP (MISCELLANEOUS) ×1
BUR NEURO DRILL SOFT 3.0X3.8M (BURR) ×2 IMPLANT
CANISTER SUCT 1200ML W/VALVE (MISCELLANEOUS) ×4 IMPLANT
CHLORAPREP W/TINT 26 (MISCELLANEOUS) ×3 IMPLANT
CNTNR SPEC 2.5X3XGRAD LEK (MISCELLANEOUS) ×1
CONT SPEC 4OZ STER OR WHT (MISCELLANEOUS) ×1
CONT SPEC 4OZ STRL OR WHT (MISCELLANEOUS) ×1
CONTAINER SPEC 2.5X3XGRAD LEK (MISCELLANEOUS) ×1 IMPLANT
COUNTER NEEDLE 20/40 LG (NEEDLE) ×2 IMPLANT
COVER WAND RF STERILE (DRAPES) ×2 IMPLANT
CUP MEDICINE 2OZ PLAST GRAD ST (MISCELLANEOUS) ×4 IMPLANT
DERMABOND ADVANCED (GAUZE/BANDAGES/DRESSINGS) ×1
DERMABOND ADVANCED .7 DNX12 (GAUZE/BANDAGES/DRESSINGS) ×1 IMPLANT
DRAPE C ARM PK CFD 31 SPINE (DRAPES) ×4 IMPLANT
DRAPE LAPAROTOMY 100X77 ABD (DRAPES) ×2 IMPLANT
DRAPE MICROSCOPE SPINE 48X150 (DRAPES) ×2 IMPLANT
DRAPE SURG 17X11 SM STRL (DRAPES) ×8 IMPLANT
DRSG TEGADERM 4X4.75 (GAUZE/BANDAGES/DRESSINGS) ×1 IMPLANT
DRSG TELFA 4X3 1S NADH ST (GAUZE/BANDAGES/DRESSINGS) ×2 IMPLANT
ELECT CAUTERY BLADE TIP 2.5 (TIP) ×2
ELECT EZSTD 165MM 6.5IN (MISCELLANEOUS)
ELECT REM PT RETURN 9FT ADLT (ELECTROSURGICAL) ×2
ELECTRODE CAUTERY BLDE TIP 2.5 (TIP) ×1 IMPLANT
ELECTRODE EZSTD 165MM 6.5IN (MISCELLANEOUS) IMPLANT
ELECTRODE REM PT RTRN 9FT ADLT (ELECTROSURGICAL) ×1 IMPLANT
GLOVE BIOGEL PI IND STRL 7.0 (GLOVE) ×1 IMPLANT
GLOVE BIOGEL PI INDICATOR 7.0 (GLOVE) ×1
GLOVE SURG SYN 7.0 (GLOVE) ×4 IMPLANT
GLOVE SURG SYN 7.0 PF PI (GLOVE) ×2 IMPLANT
GLOVE SURG SYN 8.5  E (GLOVE) ×6
GLOVE SURG SYN 8.5 E (GLOVE) ×3 IMPLANT
GLOVE SURG SYN 8.5 PF PI (GLOVE) ×3 IMPLANT
GOWN SRG XL LVL 3 NONREINFORCE (GOWNS) ×1 IMPLANT
GOWN STRL NON-REIN TWL XL LVL3 (GOWNS) ×2
GOWN STRL REUS W/ TWL XL LVL3 (GOWN DISPOSABLE) ×1 IMPLANT
GOWN STRL REUS W/TWL XL LVL3 (GOWN DISPOSABLE) ×2
GRADUATE 1200CC STRL 31836 (MISCELLANEOUS) ×2 IMPLANT
GRAFT DURAGEN MATRIX 1WX1L (Tissue) ×1 IMPLANT
KIT SPINAL PRONEVIEW (KITS) ×2 IMPLANT
KNIFE BAYONET SHORT DISCETOMY (MISCELLANEOUS) IMPLANT
MANIFOLD NEPTUNE II (INSTRUMENTS) ×2 IMPLANT
MARKER SKIN DUAL TIP RULER LAB (MISCELLANEOUS) ×2 IMPLANT
NDL SAFETY ECLIPSE 18X1.5 (NEEDLE) ×1 IMPLANT
NEEDLE HYPO 18GX1.5 SHARP (NEEDLE) ×2
NEEDLE HYPO 22GX1.5 SAFETY (NEEDLE) ×2 IMPLANT
NS IRRIG 1000ML POUR BTL (IV SOLUTION) ×2 IMPLANT
PACK LAMINECTOMY NEURO (CUSTOM PROCEDURE TRAY) ×2 IMPLANT
PAD ARMBOARD 7.5X6 YLW CONV (MISCELLANEOUS) ×2 IMPLANT
SPOGE SURGIFLO 8M (HEMOSTASIS) ×2
SPONGE SURGIFLO 8M (HEMOSTASIS) ×1 IMPLANT
SUT DVC VLOC 3-0 CL 6 P-12 (SUTURE) ×2 IMPLANT
SUT ETHILON 3-0 FS-10 30 BLK (SUTURE) ×2
SUT VIC AB 0 CT1 27 (SUTURE) ×2
SUT VIC AB 0 CT1 27XCR 8 STRN (SUTURE) ×1 IMPLANT
SUT VIC AB 2-0 CT1 18 (SUTURE) ×2 IMPLANT
SUTURE EHLN 3-0 FS-10 30 BLK (SUTURE) IMPLANT
SYR 10ML LL (SYRINGE) ×2 IMPLANT
SYR 20ML LL LF (SYRINGE) ×2 IMPLANT
SYR 30ML LL (SYRINGE) ×4 IMPLANT
SYR 3ML LL SCALE MARK (SYRINGE) ×2 IMPLANT
TOWEL OR 17X26 4PK STRL BLUE (TOWEL DISPOSABLE) ×6 IMPLANT
TUBING CONNECTING 10 (TUBING) ×2 IMPLANT

## 2020-04-11 NOTE — Op Note (Signed)
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  She had excruciating pain and inability to perform conservative management, prompting surgical intervention.  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   Preoperative Note:   Risks of surgery discussed include: infection, bleeding, stroke, coma, death, paralysis, CSF leak, nerve/spinal cord injury, numbness, tingling, weakness, complex regional pain syndrome, recurrent stenosis and/or disc herniation, vascular injury, development of instability, neck/back pain, need for further surgery, persistent symptoms, development of deformity, and the risks of anesthesia. The patient understood these risks and agreed to proceed.  Operative Note:   1. Left L4-5 laminforaminotomy for resection of extradural lesion  The patient was then brought from the preoperative center with intravenous access established.  The patient underwent general anesthesia and endotracheal tube intubation, and was then rotated on the [place] rail top where all pressure points were appropriately padded.  The skin was then thoroughly cleansed.  Perioperative antibiotic prophylaxis was administered.  Sterile prep and drapes were then applied and a timeout was then observed.  C-arm was brought into the field under sterile conditions and under lateral visualization the L4-5 interspace was identified and marked.  The incision was marked on the left and injected with local anesthetic. Once this was complete a 3 cm incision was opened with the use of a #10 blade knife.    The metrx tubes were sequentially advanced and confirmed in position at L4-5. An 28mm by 27mm  tube was locked in place to the bed side attachment.  The microscope was then sterilely brought into the field and muscle creep was hemostased with a bipolar and resected with a pituitary rongeur.  A Bovie extender was then used to expose the spinous process and lamina.  Careful attention was placed to not violate the facet capsule. A 3 mm matchstick drill bit was then used to make a hemi-laminotomy trough until the ligamentum flavum was exposed.  This was extended to the base of the spinous process .  Once this was complete and the underlying ligamentum flavum was visualized, it was dissected with a curette and resected with Kerrison rongeurs.  Extensive ligamentum hypertrophy was noted, requiring a substantial amount of time and care for removal.  The dura was identified and palpated.   At this point, the rongeur was used to begin removing the ligamentum flavum and the left-sided extradural lesion.  This was removed in piecemeal fashion until no compressive lesion was noted.  The extradural lesion was handed off for pathology.  Thus, resection of an extradural lumbar lesion was accomplished.    The kerrison rongeur was then used to remove the medial facet on the left until no compression was noted.  A balltip probe was used to confirm decompression of the ipsilateral L5 nerve root.  During removal of the synovial cyst, and adherent area of dura was also removed.  This was reconstructed with duragen and tisseel with minimal leak.    A Depo-Medrol soaked Gelfoam pledget was placed in the defect.  The wound was copiously irrigated. The tube system was then removed under microscopic visualization and hemostasis was obtained with a bipolar.    The fascial layer was reapproximated with the use of a  0 Vicryl suture.  Subcutaneous tissue layer was reapproximated using 2-0 Vicryl suture.  3-0 nylon was placed on the skin.  A dressing was placed.  I performed the entire procedure with the assistance of  Lonell Face NP as an Pensions consultant.  Harjot Zavadil K. Izora Ribas MD

## 2020-04-11 NOTE — Anesthesia Postprocedure Evaluation (Signed)
Anesthesia Post Note  Patient: Janice Riley  Procedure(s) Performed: LEFT L4-5 SYNOVIAL CYST RESECTION (N/A )  Patient location during evaluation: PACU Anesthesia Type: General Level of consciousness: awake and alert Pain management: pain level controlled Vital Signs Assessment: post-procedure vital signs reviewed and stable Respiratory status: spontaneous breathing, nonlabored ventilation, respiratory function stable and patient connected to nasal cannula oxygen Cardiovascular status: blood pressure returned to baseline and stable Postop Assessment: no apparent nausea or vomiting Anesthetic complications: no   No complications documented.   Last Vitals:  Vitals:   04/11/20 1416 04/11/20 1444  BP: (!) 170/84 (!) 168/78  Pulse: 81 82  Resp: 19 16  Temp:  36.7 C  SpO2: 100% 97%    Last Pain:  Vitals:   04/11/20 1444  TempSrc: Tympanic  PainSc: 0-No pain                 Molli Barrows

## 2020-04-11 NOTE — Anesthesia Preprocedure Evaluation (Signed)
Anesthesia Evaluation  Patient identified by MRN, date of birth, ID band Patient awake    Reviewed: Allergy & Precautions, NPO status , Patient's Chart, lab work & pertinent test results  History of Anesthesia Complications Negative for: history of anesthetic complications  Airway Mallampati: II  TM Distance: >3 FB Neck ROM: Full    Dental  (+) Upper Dentures, Lower Dentures   Pulmonary neg pulmonary ROS, neg sleep apnea, neg COPD,    breath sounds clear to auscultation- rhonchi (-) wheezing      Cardiovascular (-) hypertension(-) CAD, (-) Past MI, (-) Cardiac Stents and (-) CABG  Rhythm:Regular Rate:Normal - Systolic murmurs and - Diastolic murmurs    Neuro/Psych neg Seizures negative neurological ROS  negative psych ROS   GI/Hepatic negative GI ROS, Neg liver ROS,   Endo/Other  neg diabetesHypothyroidism   Renal/GU      Musculoskeletal  (+) Arthritis ,   Abdominal (+) - obese,   Peds  Hematology negative hematology ROS (+)   Anesthesia Other Findings Past Medical History: No date: Arthritis     Comment:  FINGERS 07/06/2017: Breast cancer, stage 0, right     Comment:  High grade DCIS, lateral margin 83mm. ER/PR positive.               Mammosite RX.  05/2017: Dysrhythmia     Comment:  complete workup by Dr. Nehemiah Massed.  all was good No date: Hypothyroidism 2019: Personal history of radiation therapy     Comment:  Right breast - mammotone rx 02/08/2020: Squamous cell carcinoma of skin     Comment:  left pretibia/EDC No date: Thyroid disease]   Reproductive/Obstetrics                             Anesthesia Physical Anesthesia Plan  ASA: II  Anesthesia Plan: General   Post-op Pain Management:    Induction: Intravenous  PONV Risk Score and Plan: 2 and Ondansetron and Dexamethasone  Airway Management Planned: Oral ETT  Additional Equipment:   Intra-op Plan:   Post-operative  Plan: Extubation in OR  Informed Consent: I have reviewed the patients History and Physical, chart, labs and discussed the procedure including the risks, benefits and alternatives for the proposed anesthesia with the patient or authorized representative who has indicated his/her understanding and acceptance.     Dental advisory given  Plan Discussed with: CRNA and Anesthesiologist  Anesthesia Plan Comments:         Anesthesia Quick Evaluation

## 2020-04-11 NOTE — Discharge Summary (Signed)
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   Physical Exam: Vitals:   04/11/20 1343 04/11/20 1352  BP: (!) (P) 158/79   Pulse:  (!) 36  Resp:  (!) 23  Temp: (!) (P) 97.1 F (36.2 C)   SpO2:  100%    General: In no apparent distress, lying in bed Strength:5/5 throughout  Sensation: intact and symmetric throughout  Skin: dressing clean, dry, intact  Data:  Recent Labs  Lab 04/09/20 1408  NA 137  K 3.2*  CL 107  CO2 23  BUN 17  CREATININE 0.98  GLUCOSE 89  CALCIUM 8.2*   No results for input(s): AST, ALT, ALKPHOS in the last 168 hours.  Invalid input(s): TBILI   Recent Labs  Lab 04/09/20 1408  WBC 7.2  HGB 11.9*  HCT 36.9  PLT 280   Recent Labs  Lab 04/09/20 1408  APTT 34  INR 0.9         Assessment/Plan:  Daneil Dan is POD0 s/p  L L4-5 synovial cyst resection.  Once she is able to ambulate, tolerate some PO, and urinate, she is cleared for discharge to home.    She will follow up in two weeks with me in clinic.    Lonell Face, NP Department of Neurosurgery

## 2020-04-11 NOTE — Anesthesia Procedure Notes (Signed)
Procedure Name: Intubation Date/Time: 04/11/2020 12:06 PM Performed by: Lily Peer, Magdelena Kinsella, CRNA Pre-anesthesia Checklist: Patient identified, Emergency Drugs available, Suction available and Patient being monitored Patient Re-evaluated:Patient Re-evaluated prior to induction Oxygen Delivery Method: Circle system utilized Preoxygenation: Pre-oxygenation with 100% oxygen Induction Type: IV induction Ventilation: Mask ventilation without difficulty Laryngoscope Size: McGraph and 3 Grade View: Grade I Tube type: Oral Tube size: 7.0 mm Number of attempts: 1 Airway Equipment and Method: Stylet and Video-laryngoscopy Placement Confirmation: ETT inserted through vocal cords under direct vision,  positive ETCO2 and breath sounds checked- equal and bilateral Secured at: 20 cm Tube secured with: Tape Dental Injury: Teeth and Oropharynx as per pre-operative assessment

## 2020-04-11 NOTE — Discharge Instructions (Addendum)
Please take it easy for the next day. If you develop a headache, please call our office at [phone].  Your surgeon has performed an operation on your lumbar spine (low back) to relieve pressure on one or more nerves. Many times, patients feel better immediately after surgery and can "overdo it." Even if you feel well, it is important that you follow these activity guidelines. If you do not let your back heal properly from the surgery, you can increase the chance of a disc herniation and/or return of your symptoms. The following are instructions to help in your recovery once you have been discharged from the hospital.  * Do not take anti-inflammatory medications for 3 days after surgery (naproxen [Aleve], ibuprofen [Advil, Motrin], celecoxib [Celebrex], etc.)  Activity    No bending, lifting, or twisting ("BLT"). Avoid lifting objects heavier than 10 pounds (gallon milk jug).  Where possible, avoid household activities that involve lifting, bending, pushing, or pulling such as laundry, vacuuming, grocery shopping, and childcare. Try to arrange for help from friends and family for these activities while your back heals.  Increase physical activity slowly as tolerated.  Taking short walks is encouraged, but avoid strenuous exercise. Do not jog, run, bicycle, lift weights, or participate in any other exercises unless specifically allowed by your doctor. Avoid prolonged sitting, including car rides.  Talk to your doctor before resuming sexual activity.  You should not drive until cleared by your doctor.  Until released by your doctor, you should not return to work or school.  You should rest at home and let your body heal.   You may shower two days after your surgery.  After showering, lightly dab your incision dry. Do not take a tub bath or go swimming for 3 weeks, or until approved by your doctor at your follow-up appointment.  If you smoke, we strongly recommend that you quit.  Smoking has been  proven to interfere with normal healing in your back and will dramatically reduce the success rate of your surgery. Please contact [facility] (800-QUIT-NOW) and use the resources at www.[facility].com for assistance in stopping smoking.  Surgical Incision   If you have a dressing on your incision, you may remove it three days after your surgery. Keep your incision area clean and dry.  If you have staples or stitches on your incision, you should have a follow up scheduled for removal. If you do not have staples or stitches, you will have steri-strips (small pieces of surgical tape) or Dermabond glue. The steri-strips/glue should begin to peel away within about a week (it is fine if the steri-strips fall off before then). If the strips are still in place one week after your surgery, you may gently remove them.  Diet            You may return to your usual diet. Be sure to stay hydrated.  When to Contact us  Although your surgery and recovery will likely be uneventful, you may have some residual numbness, aches, and pains in your back and/or legs. This is normal and should improve in the next few weeks.  However, should you experience any of the following, contact us immediately: . New numbness or weakness . Pain that is progressively getting worse, and is not relieved by your pain medications or rest . Bleeding, redness, swelling, pain, or drainage from surgical incision . Chills or flu-like symptoms . Fever greater than 101.0 F (38.3 C) . Problems with bowel or bladder functions . Difficulty  breathing or shortness of breath . Warmth, tenderness, or swelling in your calf  Contact Information . During office hours (Monday-Friday 9 am to 5 pm), please call your physician at [phone] . After hours and weekends, please call [phone] and an answering service will put you in touch with either Dr. Lacinda Axon or Dr. Izora Ribas.  . For a life-threatening emergency, call [place]   1) The drugs that you were given will stay in your system until tomorrow so for the next 24 hours you should not:  A) Drive an automobile B) Make any legal decisions C) Drink any alcoholic beverage   2) You may resume regular meals tomorrow.  Today it is better to start with liquids and gradually work up to solid foods.  You may eat anything you prefer, but it is better to start with liquids, then soup and crackers, and gradually work up to solid foods.   3) Please notify your doctor immediately if you have any unusual bleeding, trouble breathing, redness and pain at the surgery site, drainage, fever, or pain not relieved by medication.    4) Additional Instructions:        Please contact your physician with any problems or Same Day Surgery at [phone], Monday through Friday 6 am to 4 pm, or [place] at [facility] number at [phone].   AMBULATORY SURGERY  DISCHARGE INSTRUCTIONS   5) The drugs that you were given will stay in your system until tomorrow so for the next 24 hours you should not:  D) Drive an automobile E) Make any legal decisions F) Drink any alcoholic beverage   6) You may resume regular meals tomorrow.  Today it is better to start with liquids and gradually work up to solid foods.  You may eat anything you prefer, but it is better to start with liquids, then soup and crackers, and gradually work up to solid foods.   7) Please notify your doctor immediately if you have any unusual bleeding, trouble breathing, redness and pain at the surgery site, drainage, fever, or pain not relieved by medication.    8) Additional Instructions:        Please contact your physician with any problems or Same Day Surgery at [phone], Monday through Friday 6 am to 4 pm, or [place] at [facility] number at [phone].

## 2020-04-11 NOTE — Transfer of Care (Signed)
Immediate Anesthesia Transfer of Care Note  Patient: Janice Riley  Procedure(s) Performed: LEFT L4-5 SYNOVIAL CYST RESECTION (N/A )  Patient Location: PACU  Anesthesia Type:General  Level of Consciousness: awake, alert  and oriented  Airway & Oxygen Therapy: Patient Spontanous Breathing and Patient connected to face mask oxygen  Post-op Assessment: Report given to RN and Post -op Vital signs reviewed and stable  Post vital signs: Reviewed and stable  Last Vitals:  Vitals Value Taken Time  BP 158/79 04/11/20 1343  Temp    Pulse 83 04/11/20 1345  Resp 25 04/11/20 1345  SpO2 100 % 04/11/20 1345  Vitals shown include unvalidated device data.  Last Pain:  Vitals:   04/11/20 1108  TempSrc: Oral  PainSc: 0-No pain         Complications: No complications documented.

## 2020-04-11 NOTE — H&P (Signed)
I have reviewed and confirmed my history and physical from 04/04/20 with no additions or changes. Plan for L4-5 laminoforaminotomy for resection of extradural lesion.  Risks and benefits reviewed.  Heart sounds normal no MRG. Chest Clear to Auscultation Bilaterally.

## 2020-04-12 ENCOUNTER — Inpatient Hospital Stay
Admission: EM | Admit: 2020-04-12 | Discharge: 2020-04-17 | DRG: 947 | Disposition: A | Discharge status: Skilled Nursing Facility | Payer: Medicare PPO | Attending: Internal Medicine | Admitting: Internal Medicine

## 2020-04-12 ENCOUNTER — Emergency Department: Payer: Medicare PPO

## 2020-04-12 ENCOUNTER — Encounter: Payer: Self-pay | Admitting: Emergency Medicine

## 2020-04-12 ENCOUNTER — Other Ambulatory Visit: Payer: Self-pay

## 2020-04-12 DIAGNOSIS — M79605 Pain in left leg: Secondary | ICD-10-CM

## 2020-04-12 DIAGNOSIS — G8918 Other acute postprocedural pain: Secondary | ICD-10-CM | POA: Diagnosis not present

## 2020-04-12 DIAGNOSIS — D62 Acute posthemorrhagic anemia: Secondary | ICD-10-CM | POA: Diagnosis present

## 2020-04-12 DIAGNOSIS — M549 Dorsalgia, unspecified: Secondary | ICD-10-CM | POA: Diagnosis present

## 2020-04-12 DIAGNOSIS — Z20822 Contact with and (suspected) exposure to covid-19: Secondary | ICD-10-CM | POA: Diagnosis present

## 2020-04-12 DIAGNOSIS — E876 Hypokalemia: Secondary | ICD-10-CM | POA: Diagnosis present

## 2020-04-12 DIAGNOSIS — I4891 Unspecified atrial fibrillation: Secondary | ICD-10-CM | POA: Diagnosis present

## 2020-04-12 DIAGNOSIS — Z803 Family history of malignant neoplasm of breast: Secondary | ICD-10-CM

## 2020-04-12 DIAGNOSIS — W19XXXA Unspecified fall, initial encounter: Secondary | ICD-10-CM

## 2020-04-12 DIAGNOSIS — Z923 Personal history of irradiation: Secondary | ICD-10-CM

## 2020-04-12 DIAGNOSIS — E039 Hypothyroidism, unspecified: Secondary | ICD-10-CM | POA: Diagnosis present

## 2020-04-12 DIAGNOSIS — Z888 Allergy status to other drugs, medicaments and biological substances status: Secondary | ICD-10-CM

## 2020-04-12 DIAGNOSIS — M1990 Unspecified osteoarthritis, unspecified site: Secondary | ICD-10-CM | POA: Diagnosis present

## 2020-04-12 DIAGNOSIS — Z170 Estrogen receptor positive status [ER+]: Secondary | ICD-10-CM

## 2020-04-12 DIAGNOSIS — R29898 Other symptoms and signs involving the musculoskeletal system: Secondary | ICD-10-CM

## 2020-04-12 DIAGNOSIS — R260 Ataxic gait: Secondary | ICD-10-CM | POA: Diagnosis present

## 2020-04-12 DIAGNOSIS — D0511 Intraductal carcinoma in situ of right breast: Secondary | ICD-10-CM | POA: Diagnosis present

## 2020-04-12 DIAGNOSIS — M545 Low back pain, unspecified: Secondary | ICD-10-CM | POA: Diagnosis present

## 2020-04-12 DIAGNOSIS — R32 Unspecified urinary incontinence: Secondary | ICD-10-CM | POA: Diagnosis present

## 2020-04-12 DIAGNOSIS — Z85828 Personal history of other malignant neoplasm of skin: Secondary | ICD-10-CM

## 2020-04-12 DIAGNOSIS — F05 Delirium due to known physiological condition: Secondary | ICD-10-CM | POA: Diagnosis present

## 2020-04-12 DIAGNOSIS — G9341 Metabolic encephalopathy: Secondary | ICD-10-CM | POA: Diagnosis present

## 2020-04-12 DIAGNOSIS — Z9011 Acquired absence of right breast and nipple: Secondary | ICD-10-CM

## 2020-04-12 DIAGNOSIS — Z79890 Hormone replacement therapy (postmenopausal): Secondary | ICD-10-CM

## 2020-04-12 DIAGNOSIS — Z79899 Other long term (current) drug therapy: Secondary | ICD-10-CM

## 2020-04-12 LAB — COMPREHENSIVE METABOLIC PANEL
ALT: 16 U/L (ref 0–44)
AST: 27 U/L (ref 15–41)
Albumin: 3.9 g/dL (ref 3.5–5.0)
Alkaline Phosphatase: 65 U/L (ref 38–126)
Anion gap: 11 (ref 5–15)
BUN: 14 mg/dL (ref 8–23)
CO2: 23 mmol/L (ref 22–32)
Calcium: 8.3 mg/dL — ABNORMAL LOW (ref 8.9–10.3)
Chloride: 102 mmol/L (ref 98–111)
Creatinine, Ser: 0.98 mg/dL (ref 0.44–1.00)
GFR, Estimated: 58 mL/min — ABNORMAL LOW (ref >60)
Glucose, Bld: 129 mg/dL — ABNORMAL HIGH (ref 70–99)
Potassium: 3.4 mmol/L — ABNORMAL LOW (ref 3.5–5.1)
Sodium: 136 mmol/L (ref 135–145)
Total Bilirubin: 0.7 mg/dL (ref 0.3–1.2)
Total Protein: 7.3 g/dL (ref 6.5–8.1)

## 2020-04-12 LAB — TROPONIN I (HIGH SENSITIVITY): Troponin I (High Sensitivity): 17 ng/L (ref <18)

## 2020-04-12 LAB — RESP PANEL BY RT-PCR (FLU A&B, COVID) ARPGX2
Influenza A by PCR: NEGATIVE
Influenza B by PCR: NEGATIVE
SARS Coronavirus 2 by RT PCR: NEGATIVE

## 2020-04-12 LAB — CBC
HCT: 37.2 % (ref 36.0–46.0)
Hemoglobin: 12.3 g/dL (ref 12.0–15.0)
MCH: 30.8 pg (ref 26.0–34.0)
MCHC: 33.1 g/dL (ref 30.0–36.0)
MCV: 93.2 fL (ref 80.0–100.0)
Platelets: 248 10*3/uL (ref 150–400)
RBC: 3.99 MIL/uL (ref 3.87–5.11)
RDW: 14.6 % (ref 11.5–15.5)
WBC: 12.9 10*3/uL — ABNORMAL HIGH (ref 4.0–10.5)
nRBC: 0 % (ref 0.0–0.2)

## 2020-04-12 LAB — URINE CULTURE: Culture: >=100000 — AB

## 2020-04-12 LAB — ABO/RH: ABO/RH(D): O POS

## 2020-04-12 MED ORDER — ONDANSETRON HCL 4 MG PO TABS: 4.0000 mg | ORAL_TABLET | Freq: Four times a day (QID) | ORAL | Status: DC | PRN

## 2020-04-12 MED ORDER — ZOLPIDEM TARTRATE 5 MG PO TABS: 5.0000 mg | ORAL_TABLET | Freq: Every evening | ORAL | Status: DC | PRN

## 2020-04-12 MED ORDER — MORPHINE SULFATE (PF) 2 MG/ML IV SOLN
1.0000 mg | INTRAVENOUS | Status: DC | PRN
  Administered 2020-04-13 (×2): 2 mg via INTRAVENOUS
  Filled 2020-04-12 (×2): qty 1

## 2020-04-12 MED ORDER — ENOXAPARIN SODIUM 40 MG/0.4ML ~~LOC~~ SOLN
40.0000 mg | SUBCUTANEOUS | Status: DC
  Administered 2020-04-12 – 2020-04-16 (×4): 40 mg via SUBCUTANEOUS
  Filled 2020-04-12 (×5): qty 0.4

## 2020-04-12 MED ORDER — LEVOTHYROXINE SODIUM 100 MCG PO TABS
100.0000 ug | ORAL_TABLET | Freq: Every day | ORAL | Status: DC
  Administered 2020-04-13 – 2020-04-17 (×5): 100 ug via ORAL
  Filled 2020-04-12 (×3): qty 1
  Filled 2020-04-12: qty 2
  Filled 2020-04-12: qty 1

## 2020-04-12 MED ORDER — MORPHINE SULFATE (PF) 2 MG/ML IV SOLN
2.0000 mg | Freq: Once | INTRAVENOUS | Status: AC
  Administered 2020-04-12: 2 mg via INTRAVENOUS
  Filled 2020-04-12: qty 1

## 2020-04-12 MED ORDER — SODIUM CHLORIDE 0.9 % IV BOLUS
500.0000 mL | Freq: Once | INTRAVENOUS | Status: AC
  Administered 2020-04-12: 500 mL via INTRAVENOUS

## 2020-04-12 MED ORDER — ACETAMINOPHEN 325 MG PO TABS: 650.0000 mg | ORAL_TABLET | Freq: Four times a day (QID) | ORAL | Status: DC | PRN

## 2020-04-12 MED ORDER — ACETAMINOPHEN 650 MG RE SUPP: 650.0000 mg | Freq: Four times a day (QID) | RECTAL | Status: DC | PRN

## 2020-04-12 MED ORDER — DEXAMETHASONE SODIUM PHOSPHATE 4 MG/ML IJ SOLN
4.0000 mg | Freq: Two times a day (BID) | INTRAMUSCULAR | Status: DC
  Administered 2020-04-13 (×2): 4 mg via INTRAVENOUS
  Filled 2020-04-12 (×3): qty 1

## 2020-04-12 MED ORDER — ONDANSETRON HCL 4 MG/2ML IJ SOLN: 4.0000 mg | Freq: Four times a day (QID) | INTRAMUSCULAR | Status: DC | PRN

## 2020-04-12 MED ORDER — OXYCODONE HCL 5 MG PO TABS
5.0000 mg | ORAL_TABLET | Freq: Four times a day (QID) | ORAL | Status: DC | PRN
  Administered 2020-04-13 (×2): 5 mg via ORAL
  Filled 2020-04-12 (×2): qty 1

## 2020-04-12 MED ORDER — ONDANSETRON HCL 4 MG/2ML IJ SOLN
4.0000 mg | Freq: Once | INTRAMUSCULAR | Status: AC
  Administered 2020-04-12: 4 mg via INTRAVENOUS
  Filled 2020-04-12: qty 2

## 2020-04-12 MED ORDER — MAGNESIUM HYDROXIDE 400 MG/5ML PO SUSP
30.0000 mL | Freq: Every day | ORAL | Status: DC | PRN
  Administered 2020-04-15: 30 mL via ORAL
  Filled 2020-04-12: qty 30

## 2020-04-12 MED ORDER — VITAMIN D 25 MCG (1000 UNIT) PO TABS
1000.0000 [IU] | ORAL_TABLET | Freq: Every day | ORAL | Status: DC
  Administered 2020-04-14 – 2020-04-17 (×4): 1000 [IU] via ORAL
  Filled 2020-04-12 (×5): qty 1

## 2020-04-12 MED ORDER — MORPHINE SULFATE (PF) 4 MG/ML IV SOLN
4.0000 mg | Freq: Once | INTRAVENOUS | Status: AC
  Administered 2020-04-12: 4 mg via INTRAVENOUS
  Filled 2020-04-12: qty 1

## 2020-04-12 MED ORDER — METHOCARBAMOL 500 MG PO TABS
500.0000 mg | ORAL_TABLET | Freq: Four times a day (QID) | ORAL | Status: DC | PRN
  Administered 2020-04-13: 500 mg via ORAL
  Filled 2020-04-12 (×2): qty 1

## 2020-04-12 MED ORDER — SODIUM CHLORIDE 0.9 % IV SOLN: INTRAVENOUS | Status: DC

## 2020-04-12 NOTE — H&P (Signed)
PATIENT NAME: Janice Riley    MR#:  [id number]  DATE OF BIRTH:  [birth year]  DATE OF ADMISSION:  04/12/2020  PRIMARY CARE PHYSICIAN: Adin Hector, MD   REQUESTING/REFERRING PHYSICIAN: Lavonia Drafts, MD CHIEF COMPLAINT:   Chief Complaint  Patient presents with  . Atrial Fibrillation  . Fall    HISTORY OF PRESENT ILLNESS:  Janice Riley  is a 79 y.o. Caucasian female with a known history of hypothyroidism, breast cancer and L4-L5 synovial cyst that was just resected yesterday and the patient was discharged from neurosurgery service.  She felt better initially but later today has been having worsening left leg pain with decreased motility.  She was not able to ambulate and therefore came back to the ER.  She denies any paresthesias or other focal muscle weakness.  No headache or dizziness or blurred vision.  No nausea or vomiting or abdominal pain.  No chest pain or palpitations.  No cough or wheezing.  No bleeding diathesis.  Upon presentation to the emergency room, blood pressure was 134/81, temperature 99.4 with otherwise normal vital signs.  Labs revealed hypokalemia of 3.4 and CBC showed no leukocytosis 12.9.  High-sensitivity troponin was 17.  Influenza antigens and Covid 19 PCR came back negative.  Noncontrasted head CT scan revealed no acute intracranial normalities.  It showed chronic small vessel changes in the white matter cerebral hemisphere.Chest x-ray stick atelectasis at the left base with no acute cardiopulmonary disease.  The patient was given 2 mg of IV morphine sulfate and later another 4 mg with milligram of IV Zofran and 500 mill IV normal saline bolus.  She will be placed in observation in a medical bed for further evaluation and management.  PAST MEDICAL HISTORY:   Past Medical History:  Diagnosis Date  . Arthritis    FINGERS  . 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 ER/PR positive. Mammosite RX.    Marland Kitchen Dysrhythmia 05/2017   complete workup by Dr. Nehemiah Massed.  all was good  . Hypothyroidism   . Personal history of radiation therapy 2019   Right breast - mammotone rx  . Squamous cell carcinoma of skin 02/08/2020   left pretibia/EDC  . Thyroid disease     PAST SURGICAL HISTORY:   Past Surgical History:  Procedure Laterality Date  . BACK SURGERY    . 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 BREAST LUMPECTOMY Right 06/2017   DCIS  . BREAST RECONSTRUCTION WITH MASTOPLASTY Right 07/06/2017   Procedure: BREAST RECONSTRUCTION WITH MASTOPLASTY;  Surgeon: Robert Bellow, MD;  Location: [facility] ORS;  Service: General;  Laterality: Right;  . COLONOSCOPY  2016   Dr Candace Cruise  . EYE SURGERY Bilateral 2017  . LAMINECTOMY N/A 04/11/2020   Procedure: LEFT L4-5 SYNOVIAL CYST RESECTION;  Surgeon: Meade Maw, MD;  Location: [facility] ORS;  Service: Neurosurgery;  Laterality: N/A;  . MASTECTOMY, PARTIAL Right 07/06/2017   Procedure: MASTECTOMY PARTIAL;  Surgeon: Robert Bellow, MD;  Location: [facility] ORS;  Service: General;  Laterality: Right;    SOCIAL HISTORY:   Social History   Tobacco Use  . Smoking status: Never Smoker  . Smokeless tobacco: Never Used  Substance Use Topics  . Alcohol use: Yes    Comment: wine everyday     FAMILY HISTORY:   Family History  Problem Relation Age of  Onset  . Breast cancer Maternal Aunt     DRUG ALLERGIES:   Allergies  Allergen Reactions  . Tamoxifen Other (See Comments)    Cause personality changes.    REVIEW OF SYSTEMS:   ROS As per history of present illness. All pertinent systems were reviewed above. Constitutional, HEENT, cardiovascular, respiratory, GI, GU, musculoskeletal, neuro, psychiatric, endocrine, integumentary and hematologic systems were reviewed and are otherwise negative/unremarkable except for positive findings mentioned above in the HPI.   MEDICATIONS AT  HOME:   Prior to Admission medications   Medication Sig Start Date End Date Taking? Authorizing Provider  Calcium-Magnesium-Vitamin D (CALCIUM 1200+D3 PO) Take 1 tablet by mouth daily.    [provider]  cholecalciferol (VITAMIN D3) 25 MCG (1000 UNIT) tablet Take 1,000 Units by mouth daily.    [provider]  levothyroxine (SYNTHROID) 100 MCG tablet Take 100 mcg by mouth daily.  03/21/20   [provider]  methocarbamol (ROBAXIN) 500 MG tablet Take 1 tablet (500 mg total) by mouth every 6 (six) hours as needed for muscle spasms. 04/11/20 05/11/20  Lonell Face, NP  mupirocin ointment (BACTROBAN) 2 % Apply 1 application topically daily. With dressing changes Patient not taking: Reported on 04/04/2020 02/08/20   Laurence Ferrari, [place], MD  oxyCODONE (ROXICODONE) 5 MG immediate release tablet Take 1-2 tablets (5-10 mg total) by mouth every 6 (six) hours as needed for up to 7 days for moderate pain or severe pain (5mg  for moderate pain (3-6/10), 10mg  for severe pain (7-10/10)). 04/11/20 04/18/20  Lonell Face, NP      VITAL SIGNS:  Blood pressure (!) 168/90, pulse 92, temperature 99.4 F (37.4 C), temperature source Oral, resp. rate 19, height 5\' 5"  (1.651 m), weight 63.5 kg, SpO2 100 %.  PHYSICAL EXAMINATION:  Physical Exam  GENERAL:  79 y.o.-year-old Caucasian female patient lying in the bed with no acute distress.  EYES: Pupils equal, round, reactive to light and accommodation. No scleral icterus. Extraocular muscles intact.  HEENT: Head atraumatic, normocephalic. Oropharynx and nasopharynx clear.  NECK:  Supple, no jugular venous distention. No thyroid enlargement, no tenderness.  LUNGS: Normal breath sounds bilaterally, no wheezing, rales,rhonchi or crepitation. No use of accessory muscles of respiration.  CARDIOVASCULAR: Regular rate and rhythm, S1, S2 normal. No murmurs, rubs, or gallops.  ABDOMEN: Soft, nondistended, nontender. Bowel sounds present. No  organomegaly or mass.  EXTREMITIES: No pedal edema, cyanosis, or clubbing.  NEUROLOGIC: Cranial nerves II through XII are intact. Muscle strength 3-4/5 in the left leg compared to 5/5 in the right upper extremity and both right upper and lower extremity.  Sensation intact. Gait not checked.  PSYCHIATRIC: The patient is alert and oriented x 3.  Normal affect and good eye contact. SKIN: No obvious rash, lesion, or ulcer.   LABORATORY PANEL:   CBC Recent Labs  Lab 04/12/20 1723  WBC 12.9*  HGB 12.3  HCT 37.2  PLT 248   ------------------------------------------------------------------------------------------------------------------  Chemistries  Recent Labs  Lab 04/12/20 1723  NA 136  K 3.4*  CL 102  CO2 23  GLUCOSE 129*  BUN 14  CREATININE 0.98  CALCIUM 8.3*  AST 27  ALT 16  ALKPHOS 65  BILITOT 0.7   ------------------------------------------------------------------------------------------------------------------  Cardiac Enzymes No results for input(s): TROPONINI in the last 168 hours. ------------------------------------------------------------------------------------------------------------------  RADIOLOGY:  DG Lumbar Spine 2-3 Views  Result Date: 04/11/2020 CLINICAL DATA:  79 year old female undergoing lumbar spine surgery. EXAM: LUMBAR SPINE - 2-3 VIEW COMPARISON:  Lumbar MRI 02/27/2020. FINDINGS: Three  intraoperative lateral fluoroscopic spot views of the lumbar spine demonstrate evidence of normal lumbar segmentation as described on the prior MRI, and posterior instrument advancement toward the L4-L5 disc space. FLUOROSCOPY TIME:  0 minutes 5 seconds IMPRESSION: Intraoperative localization at L4-L5. Electronically Signed   By: Genevie Ann M.D.   On: 04/11/2020 12:45   CT Head Wo Contrast  Result Date: 04/12/2020 CLINICAL DATA:  [place].  Back surgery yesterday. EXAM: CT HEAD WITHOUT CONTRAST TECHNIQUE: Contiguous axial images were obtained from the base of the skull  through the vertex without intravenous contrast. COMPARISON:  None. FINDINGS: Brain: Age related atrophy. Chronic small-vessel changes of the cerebral hemispheric white matter. No sign of acute infarction, mass lesion, hemorrhage, hydrocephalus or extra-axial collection. Ventricular size is in proportion to the degree of atrophy. Vascular: There is atherosclerotic calcification of the major vessels at the base of the brain. Skull: Negative Sinuses/Orbits: Clear/normal Other: None IMPRESSION: No acute or traumatic finding. Age related atrophy. Chronic small-vessel changes of the cerebral hemispheric white matter. Electronically Signed   By: Nelson Chimes M.D.   On: 04/12/2020 18:29   DG Chest Port 1 View  Result Date: 04/12/2020 CLINICAL DATA:  Weakness EXAM: PORTABLE CHEST 1 VIEW COMPARISON:  None FINDINGS: No focal opacity or pleural effusion. Normal cardiomediastinal silhouette with aortic atherosclerosis. No pneumothorax. Streaky atelectasis left lung base. IMPRESSION: No active disease. Streaky atelectasis left base. Electronically Signed   By: Donavan Foil M.D.   On: 04/12/2020 19:04   DG C-Arm 1-60 Min-No Report  Result Date: 04/11/2020 Fluoroscopy was utilized by the requesting physician.  No radiographic interpretation.      IMPRESSION AND PLAN:   1.  Left lower extremity pain and weakness, status post left L4-L5 synovial cyst resection. -The patient will be placed in observation in a medical monitored. -We will follow neuro checks every 4 hours. -We will place the patient on IV steroid therapy with Decadron. -Pain management will be provided and as needed muscle relaxants. -Physical therapy consult to be obtained. -Neurosurgery consult will be obtained. -Dr. Izora Ribas was aware about the patient.  2.  Hypothyroidism. -We will continue Synthroid and check TSH level.  3.  DVT prophylaxis. -Subcutaneous Lovenox.  All the records are reviewed and case discussed with ED  provider. The plan of care was discussed in details with the patient (and family). I answered all questions. The patient agreed to proceed with the above mentioned plan. Further management will depend upon hospital course.   CODE STATUS: Full code  Status is: Observation  The patient remains OBS appropriate and will d/c before 2 midnights.  Dispo: The patient is from: Home              Anticipated d/c is to: Home              Anticipated d/c date is: 1 day              Patient currently is not medically stable to d/c.   TOTAL TIME TAKING CARE OF THIS PATIENT: 55 minutes.    Christel Mormon M.D on 04/12/2020 at 7:08 PM  [facility]   From 7 PM-7 AM, contact night-coverage [email or web address]  CC: Primary care physician; Adin Hector, MD

## 2020-04-12 NOTE — ED Notes (Signed)
Attempted to give report. Nurse unavailable at this time. Nurse to call back.

## 2020-04-12 NOTE — ED Triage Notes (Signed)
Patient arrives via EMS from home after a fall. Patient had back surgery yesterday for a pinch nerve. Patient states her pain does not feel much different than normal. Patient's heart rhythm per EMS is Afib w/ RVR with no known history of same. Patient is A&O x4.

## 2020-04-12 NOTE — Progress Notes (Signed)
   04/12/20 2110  Vitals  Temp 98.7 F (37.1 C)  Temp Source Oral  BP (!) 158/80  MAP (mmHg) 98  BP Location Left Arm  BP Method Automatic  Patient Position (if appropriate) Lying  Pulse Rate 94  Pulse Rate Source Monitor  Resp 16  MEWS COLOR  MEWS Score Color Green  Oxygen Therapy  SpO2 99 %  O2 Device Room Air  Pain Assessment  Pain Scale 0-10  Pain Score 0  MEWS Score  MEWS Temp 0  MEWS Systolic 0  MEWS Pulse 0  MEWS RR 0  MEWS LOC 0  MEWS Score 0   The patient is admitted to 1 A room 38 with the diagnosis of back pain. Alert and oriented x 4. Denied any acute pain at this time. The patient is oriented to her room, call bell/ascom and staff. She voiced no concern. Will continue to monitor.

## 2020-04-12 NOTE — ED Provider Notes (Signed)
[facility] Emergency Department Provider Note   ____________________________________________    I have reviewed the triage vital signs and the nursing notes.   HISTORY  Chief Complaint Back pain, weakness    HPI Janice Riley is a 79 y.o. female who presents with complaints of back pain, weakness.  Patient had synovial cyst removal by Dr. Cari Caraway of neurosurgery yesterday, did well after the procedure, this morning when she woke up she complained of significant dizziness.  Husband attempted to help her get around but pain was significant and she was having difficulty standing because of the pain.  She also complained of issues with her balance.  No nausea or vomiting.  Does have some tingling in the feet bilaterally, no weakness in the lower extremities, although when she stands reportedly has significant back pain  Past Medical History:  Diagnosis Date  . Arthritis    FINGERS  . Breast cancer, stage 0, right 07/06/2017   High grade DCIS, lateral margin 63mm. ER/PR positive. Mammosite RX.   Marland Kitchen Dysrhythmia 05/2017   complete workup by Dr. Nehemiah Massed.  all was good  . Hypothyroidism   . Personal history of radiation therapy 2019   Right breast - mammotone rx  . Squamous cell carcinoma of skin 02/08/2020   left pretibia/EDC  . Thyroid disease     Patient Active Problem List   Diagnosis Date Noted  . Back pain 04/12/2020  . Osteoporosis 02/04/2018  . 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 BACK SURGERY    . 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 BREAST LUMPECTOMY Right 06/2017   DCIS  . BREAST RECONSTRUCTION WITH MASTOPLASTY Right 07/06/2017   Procedure: BREAST RECONSTRUCTION WITH MASTOPLASTY;  Surgeon: Reneisha Stilley Bellow, MD;  Location: [facility] ORS;  Service: General;  Laterality: Right;  .  COLONOSCOPY  2016   Dr Candace Cruise  . EYE SURGERY Bilateral 2017  . LAMINECTOMY N/A 04/11/2020   Procedure: LEFT L4-5 SYNOVIAL CYST RESECTION;  Surgeon: Meade Maw, MD;  Location: [facility] ORS;  Service: Neurosurgery;  Laterality: N/A;  . MASTECTOMY, PARTIAL Right 07/06/2017   Procedure: MASTECTOMY PARTIAL;  Surgeon: Jahmiyah Dullea Bellow, MD;  Location: [facility] ORS;  Service: General;  Laterality: Right;    Prior to Admission medications   Medication Sig Start Date End Date Taking? Authorizing Provider  Calcium-Magnesium-Vitamin D (CALCIUM 1200+D3 PO) Take 1 tablet by mouth daily.   Yes [provider]  cholecalciferol (VITAMIN D3) 25 MCG (1000 UNIT) tablet Take 1,000 Units by mouth daily.   Yes [provider]  levothyroxine (SYNTHROID) 100 MCG tablet Take 100 mcg by mouth daily.  03/21/20  Yes [provider]  methocarbamol (ROBAXIN) 500 MG tablet Take 1 tablet (500 mg total) by mouth every 6 (six) hours as needed for muscle spasms. 04/11/20 05/11/20 Yes Zdeb, [place], NP  oxyCODONE (ROXICODONE) 5 MG immediate release tablet Take 1-2 tablets (5-10 mg total) by mouth every 6 (six) hours as needed for up to 7 days for moderate pain or severe pain (5mg  for moderate pain (3-6/10), 10mg  for severe pain (7-10/10)). 04/11/20 04/18/20 Yes Zdeb, Altha Harm, NP  mupirocin ointment (BACTROBAN) 2 % Apply 1 application topically daily. With dressing changes Patient not taking: Reported on 04/04/2020 02/08/20   Alfonso Patten, MD     Allergies Tamoxifen  Family History  Problem Relation  Age of Onset  . Breast cancer Maternal Aunt     Social History Social History   Tobacco Use  . Smoking status: Never Smoker  . Smokeless tobacco: Never Used  Vaping Use  . Vaping Use: Never used  Substance Use Topics  . Alcohol use: Yes    Comment: wine everyday   . Drug use: No    Review of Systems  Constitutional: No fever/chills Eyes: No visual changes.  ENT: No sore  throat. Cardiovascular: Denies chest pain. Respiratory: Denies shortness of breath. Gastrointestinal: No abdominal pain.   Genitourinary: Negative for dysuria. Musculoskeletal: Negative for back pain. Skin: Negative for rash. Neurological: As above   ____________________________________________   PHYSICAL EXAM:  VITAL SIGNS: ED Triage Vitals  Enc Vitals Group     BP 04/12/20 1715 (!) 174/81     Pulse Rate 04/12/20 1715 94     Resp 04/12/20 1715 18     Temp --      Temp src --      SpO2 04/12/20 1703 96 %     Weight 04/12/20 1709 63.5 kg (140 lb)     Height 04/12/20 1709 1.651 m (5\' 5" )     Head Circumference --      Peak Flow --      Pain Score 04/12/20 1708 7     Pain Loc --      Pain Edu? --      Excl. in [place]? --     Constitutional: Alert and oriented Eyes: Conjunctivae are normal.  Head: Atraumatic. Nose: No congestion/rhinnorhea. Mouth/Throat: Mucous membranes are moist.   Neck:  Painless ROM Cardiovascular: Normal rate, regular rhythm.  Good peripheral circulation. Respiratory: Normal respiratory effort.  No retractions.  Gastrointestinal: Soft and nontender. No distention.  No CVA tenderness.  Musculoskeletal: No lower extremity tenderness nor edema.  Warm and well perfused Neurologic:  Normal speech and language.  Patient has normal dorsiflexion and plantar flexion of both lower extremities, is able to flex at the hip bilaterally and extend and flex at the knee bilaterally.  Is able to hold each leg off the bed individually without difficulty although she does have some pain in her back when she does so.  No saddle anesthesia Skin:  Skin is warm, dry and intact. No rash noted. Psychiatric: Mood and affect are normal. Speech and behavior are normal.  ____________________________________________   LABS (all labs ordered are listed, but only abnormal results are displayed)  Labs Reviewed  CBC - Abnormal; Notable for the following components:      Result  Value   WBC 12.9 (*)    All other components within normal limits  COMPREHENSIVE METABOLIC PANEL - Abnormal; Notable for the following components:   Potassium 3.4 (*)    Glucose, Bld 129 (*)    Calcium 8.3 (*)    GFR, Estimated 58 (*)    All other components within normal limits  RESP PANEL BY RT-PCR (FLU A&B, COVID) [id number]  URINALYSIS, COMPLETE (UACMP) WITH MICROSCOPIC  TROPONIN I (HIGH SENSITIVITY)   ____________________________________________  EKG  ED ECG REPORT I, Lavonia Drafts, the attending physician, personally viewed and interpreted this ECG.  Date: 04/12/2020  Rhythm: normal sinus rhythm QRS Axis: normal Intervals: Right bundle branch block ST/T Wave abnormalities: normal Narrative Interpretation: PACs noted  ____________________________________________  RADIOLOGY  Chest x-ray without infiltrate, reviewed by me ____________________________________________   PROCEDURES  Procedure(s) performed: No  Procedures   Critical Care performed: No ____________________________________________   INITIAL IMPRESSION /  ASSESSMENT AND PLAN / ED COURSE  Pertinent labs & imaging results that were available during my care of the patient were reviewed by me and considered in my medical decision making (see chart for details).  Patient presents with back pain status post surgery as detailed above.  Initially patient complained primarily of issues with balance that started when she woke up this morning however husband thinks it is more pain related or possibly weakness in the legs.  Sent for CT of the head given patient's description of balance issues.  Lab work is overall reassuring, mild elevation of white blood cell count, no chest pain, EKG demonstrates frequent PACs but no evidence of ischemia  Chemistries are reassuring.  CT head reviewed by me, no abnormalities, confirmed by radiology  Discussed case with Dr. Izora Ribas, patient's neurosurgeon, he notes that given  reassuring motor exam no indication for emergent MRI at this time, will see in the hospital, have admitted to the hospitalist service  Patient treated with IV morphine and IV zofran    ____________________________________________   FINAL CLINICAL IMPRESSION(S) / ED DIAGNOSES  Final diagnoses:  Fall, initial encounter  Postoperative pain after spinal surgery        Note:  This document was prepared using Dragon voice recognition software and may include unintentional dictation errors.   Lavonia Drafts, MD 04/12/20 [phone]

## 2020-04-13 DIAGNOSIS — R260 Ataxic gait: Secondary | ICD-10-CM | POA: Diagnosis present

## 2020-04-13 DIAGNOSIS — G8918 Other acute postprocedural pain: Secondary | ICD-10-CM | POA: Diagnosis present

## 2020-04-13 DIAGNOSIS — Z79899 Other long term (current) drug therapy: Secondary | ICD-10-CM | POA: Diagnosis not present

## 2020-04-13 DIAGNOSIS — Z803 Family history of malignant neoplasm of breast: Secondary | ICD-10-CM | POA: Diagnosis not present

## 2020-04-13 DIAGNOSIS — Z85828 Personal history of other malignant neoplasm of skin: Secondary | ICD-10-CM | POA: Diagnosis not present

## 2020-04-13 DIAGNOSIS — M1990 Unspecified osteoarthritis, unspecified site: Secondary | ICD-10-CM | POA: Diagnosis present

## 2020-04-13 DIAGNOSIS — Z923 Personal history of irradiation: Secondary | ICD-10-CM | POA: Diagnosis not present

## 2020-04-13 DIAGNOSIS — Z79890 Hormone replacement therapy (postmenopausal): Secondary | ICD-10-CM | POA: Diagnosis not present

## 2020-04-13 DIAGNOSIS — M545 Low back pain, unspecified: Secondary | ICD-10-CM | POA: Diagnosis present

## 2020-04-13 DIAGNOSIS — M549 Dorsalgia, unspecified: Secondary | ICD-10-CM | POA: Diagnosis present

## 2020-04-13 DIAGNOSIS — I4891 Unspecified atrial fibrillation: Secondary | ICD-10-CM | POA: Diagnosis present

## 2020-04-13 DIAGNOSIS — G9341 Metabolic encephalopathy: Secondary | ICD-10-CM

## 2020-04-13 DIAGNOSIS — Z170 Estrogen receptor positive status [ER+]: Secondary | ICD-10-CM | POA: Diagnosis not present

## 2020-04-13 DIAGNOSIS — Z9011 Acquired absence of right breast and nipple: Secondary | ICD-10-CM | POA: Diagnosis not present

## 2020-04-13 DIAGNOSIS — Z20822 Contact with and (suspected) exposure to covid-19: Secondary | ICD-10-CM | POA: Diagnosis present

## 2020-04-13 DIAGNOSIS — D62 Acute posthemorrhagic anemia: Secondary | ICD-10-CM | POA: Diagnosis present

## 2020-04-13 DIAGNOSIS — F05 Delirium due to known physiological condition: Secondary | ICD-10-CM | POA: Diagnosis present

## 2020-04-13 DIAGNOSIS — R32 Unspecified urinary incontinence: Secondary | ICD-10-CM | POA: Diagnosis present

## 2020-04-13 DIAGNOSIS — D0511 Intraductal carcinoma in situ of right breast: Secondary | ICD-10-CM | POA: Diagnosis present

## 2020-04-13 DIAGNOSIS — E039 Hypothyroidism, unspecified: Secondary | ICD-10-CM | POA: Diagnosis present

## 2020-04-13 DIAGNOSIS — Z888 Allergy status to other drugs, medicaments and biological substances status: Secondary | ICD-10-CM | POA: Diagnosis not present

## 2020-04-13 DIAGNOSIS — E876 Hypokalemia: Secondary | ICD-10-CM | POA: Diagnosis present

## 2020-04-13 LAB — CBC
HCT: 36.1 % (ref 36.0–46.0)
Hemoglobin: 11.9 g/dL — ABNORMAL LOW (ref 12.0–15.0)
MCH: 30.3 pg (ref 26.0–34.0)
MCHC: 33 g/dL (ref 30.0–36.0)
MCV: 91.9 fL (ref 80.0–100.0)
Platelets: 258 10*3/uL (ref 150–400)
RBC: 3.93 MIL/uL (ref 3.87–5.11)
RDW: 14.6 % (ref 11.5–15.5)
WBC: 14.5 10*3/uL — ABNORMAL HIGH (ref 4.0–10.5)
nRBC: 0 % (ref 0.0–0.2)

## 2020-04-13 LAB — URINALYSIS, COMPLETE (UACMP) WITH MICROSCOPIC
Bacteria, UA: NONE SEEN
Bilirubin Urine: NEGATIVE
Glucose, UA: NEGATIVE mg/dL
Hgb urine dipstick: NEGATIVE
Ketones, ur: 20 mg/dL — AB
Leukocytes,Ua: NEGATIVE
Nitrite: NEGATIVE
Protein, ur: NEGATIVE mg/dL
Specific Gravity, Urine: 1.018 (ref 1.005–1.030)
pH: 6 (ref 5.0–8.0)

## 2020-04-13 LAB — BASIC METABOLIC PANEL
Anion gap: 11 (ref 5–15)
BUN: 9 mg/dL (ref 8–23)
CO2: 23 mmol/L (ref 22–32)
Calcium: 7.8 mg/dL — ABNORMAL LOW (ref 8.9–10.3)
Chloride: 101 mmol/L (ref 98–111)
Creatinine, Ser: 0.76 mg/dL (ref 0.44–1.00)
GFR, Estimated: >60 mL/min (ref >60)
Glucose, Bld: 117 mg/dL — ABNORMAL HIGH (ref 70–99)
Potassium: 3.6 mmol/L (ref 3.5–5.1)
Sodium: 135 mmol/L (ref 135–145)

## 2020-04-13 LAB — TSH: TSH: 0.905 u[IU]/mL (ref 0.350–4.500)

## 2020-04-13 LAB — SURGICAL PATHOLOGY

## 2020-04-13 MED ORDER — METHOCARBAMOL 500 MG PO TABS
500.0000 mg | ORAL_TABLET | Freq: Three times a day (TID) | ORAL | Status: DC | PRN
  Administered 2020-04-16: 500 mg via ORAL
  Filled 2020-04-13: qty 1

## 2020-04-13 MED ORDER — KETOROLAC TROMETHAMINE 15 MG/ML IJ SOLN: 15.0000 mg | Freq: Four times a day (QID) | INTRAMUSCULAR | Status: AC | PRN

## 2020-04-13 MED ORDER — ACETAMINOPHEN 500 MG PO TABS
1000.0000 mg | ORAL_TABLET | Freq: Three times a day (TID) | ORAL | Status: DC
  Administered 2020-04-13 – 2020-04-17 (×11): 1000 mg via ORAL
  Filled 2020-04-13 (×12): qty 2

## 2020-04-13 MED ORDER — OXYCODONE HCL 5 MG PO TABS
5.0000 mg | ORAL_TABLET | Freq: Four times a day (QID) | ORAL | Status: DC | PRN
  Administered 2020-04-14 – 2020-04-17 (×7): 5 mg via ORAL
  Filled 2020-04-13 (×7): qty 1

## 2020-04-13 MED ORDER — POLYETHYLENE GLYCOL 3350 17 G PO PACK
17.0000 g | PACK | Freq: Every day | ORAL | Status: DC
  Administered 2020-04-14 – 2020-04-17 (×4): 17 g via ORAL
  Filled 2020-04-13 (×4): qty 1

## 2020-04-13 NOTE — Consult Note (Signed)
Referring Physician:  No referring provider defined for this encounter.  Primary Physician:  Adin Hector, MD  Chief Complaint:  Back pain, poor mobility after surgery  History of Present Illness: 04/13/2020 Janice Riley is a 79 y.o. female who presents with the chief complaint of worsening back pain and poor mobility after surgery on Wednesday.  She did well on Wednesday, but then had worsening pain yesterday, prompting an ER visit.  I had recommended that to evaluate for worsening motor weakness and possible post-operative issue.  Fortunately, she did not have any weakness or new numbness.  This morning she describes left leg pain and low back pain.  She denies weakness or numbness.  She can control her bowel and bladder.    Review of Systems:  A 10 point review of systems is negative, except for the pertinent positives and negatives detailed in the HPI.  Past Medical History: Past Medical History:  Diagnosis Date  . Arthritis    FINGERS  . Breast cancer, stage 0, right 07/06/2017   High grade DCIS, lateral margin 79mm. ER/PR positive. Mammosite RX.   Marland Kitchen Dysrhythmia 05/2017   complete workup by Dr. Nehemiah Massed.  all was good  . Hypothyroidism   . Personal history of radiation therapy 2019   Right breast - mammotone rx  . Squamous cell carcinoma of skin 02/08/2020   left pretibia/EDC  . Thyroid disease     Past Surgical History: Past Surgical History:  Procedure Laterality Date  . BACK SURGERY    . 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 BREAST LUMPECTOMY Right 06/2017   DCIS  . BREAST RECONSTRUCTION WITH MASTOPLASTY Right 07/06/2017   Procedure: BREAST RECONSTRUCTION WITH MASTOPLASTY;  Surgeon: Robert Bellow, MD;  Location: [facility] ORS;  Service: General;  Laterality: Right;  . COLONOSCOPY  2016   Dr Candace Cruise  . EYE SURGERY Bilateral 2017  . LAMINECTOMY N/A 04/11/2020   Procedure: LEFT L4-5  SYNOVIAL CYST RESECTION;  Surgeon: Meade Maw, MD;  Location: [facility] ORS;  Service: Neurosurgery;  Laterality: N/A;  . MASTECTOMY, PARTIAL Right 07/06/2017   Procedure: MASTECTOMY PARTIAL;  Surgeon: Robert Bellow, MD;  Location: [facility] ORS;  Service: General;  Laterality: Right;    Allergies: Allergies as of 04/12/2020 - Review Complete 04/12/2020  Allergen Reaction Noted  . Tamoxifen Other (See Comments) 07/27/2018    Medications:  Current Facility-Administered Medications:  .  0.9 %  sodium chloride infusion, , Intravenous, Continuous, Mansy, Jan A, MD, Last Rate: 100 mL/hr at 04/13/20 0628, New Bag at 04/13/20 1610 .  acetaminophen (TYLENOL) tablet 650 mg, 650 mg, Oral, Q6H PRN **OR** acetaminophen (TYLENOL) suppository 650 mg, 650 mg, Rectal, Q6H PRN, Mansy, Jan A, MD .  cholecalciferol (VITAMIN D3) tablet 1,000 Units, 1,000 Units, Oral, Daily, Mansy, Jan A, MD .  dexamethasone (DECADRON) injection 4 mg, 4 mg, Intravenous, Q12H, Mansy, Jan A, MD, 4 mg at 04/13/20 0108 .  enoxaparin (LOVENOX) injection 40 mg, 40 mg, Subcutaneous, Q24H, Mansy, Jan A, MD, 40 mg at 04/12/20 2132 .  levothyroxine (SYNTHROID) tablet 100 mcg, 100 mcg, Oral, Daily, Mansy, Jan A, MD, 100 mcg at 04/13/20 0502 .  magnesium hydroxide (MILK OF MAGNESIA) suspension 30 mL, 30 mL, Oral, Daily PRN, Mansy, Jan A, MD .  methocarbamol (ROBAXIN) tablet 500 mg, 500 mg, Oral, Q6H PRN, Mansy, Jan A, MD, 500 mg at 04/13/20 0435 .  morphine 2 MG/ML  injection 1-2 mg, 1-2 mg, Intravenous, Q4H PRN, Mansy, Jan A, MD, 2 mg at 04/13/20 0551 .  ondansetron (ZOFRAN) tablet 4 mg, 4 mg, Oral, Q6H PRN **OR** ondansetron (ZOFRAN) injection 4 mg, 4 mg, Intravenous, Q6H PRN, Mansy, Jan A, MD .  oxyCODONE (Oxy IR/ROXICODONE) immediate release tablet 5-10 mg, 5-10 mg, Oral, Q6H PRN, Mansy, Jan A, MD, 5 mg at 04/13/20 9622 .  zolpidem (AMBIEN) tablet 5 mg, 5 mg, Oral, QHS PRN, Mansy, Arvella Merles, MD   Social History: Social History    Tobacco Use  . Smoking status: Never Smoker  . Smokeless tobacco: Never Used  Vaping Use  . Vaping Use: Never used  Substance Use Topics  . Alcohol use: Yes    Comment: wine everyday   . Drug use: No    Family Medical History: Family History  Problem Relation Age of Onset  . Breast cancer Maternal Aunt     Physical Examination: Vitals:   04/13/20 0007 04/13/20 0439  BP: (!) 182/84 (!) 181/90  Pulse: 64 89  Resp: 16 16  Temp: 99.4 F (37.4 C) 99.1 F (37.3 C)  SpO2: 96% 98%     General: Patient is well developed, well nourished, calm, collected, and in no apparent distress.  Psychiatric: Patient is non-anxious.  Head:  Pupils equal, round, and reactive to light.  ENT:  Oral mucosa appears well hydrated.  Neck:   Supple.  Full range of motion.  Respiratory: Patient is breathing without any difficulty.  Extremities: No edema.  Vascular: Palpable pulses in dorsal pedal vessels.  Skin:   On exposed skin, there are no abnormal skin lesions.  NEUROLOGICAL:  General: In no acute distress.   Awake, alert, oriented to person, place, and time.  Pupils equal round and reactive to light.  Facial tone is symmetric.  Tongue protrusion is midline.  There is no pronator drift.   Strength: Side Biceps Triceps Deltoid Interossei Grip Wrist Ext. Wrist Flex.  R 5 5 5 5 5 5 5   L 5 5 5 5 5 5 5    Side Iliopsoas Quads Hamstring PF DF EHL  R 5 5 5 5 5 5   L 5 5 5 5 5 5    Reflexes are 1+ and symmetric at the biceps, triceps, brachioradialis, patella and achilles.   Bilateral upper and lower extremity sensation is intact to light touch. Gait is untested.    Imaging: None new to review.   I have personally reviewed the images and agree with the above interpretation.  Labs: CBC Latest Ref Rng & Units 04/13/2020 04/12/2020 04/09/2020  WBC 4.0 - 10.5 K/uL 14.5(H) 12.9(H) 7.2  Hemoglobin 12.0 - 15.0 g/dL 11.9(L) 12.3 11.9(L)  Hematocrit 36 - 46 % 36.1 37.2 36.9  Platelets  150 - 400 K/uL 258 248 280       Assessment and Plan: [name] is a pleasant 79 y.o. female with pain exacerbation after L L4-5 synovial cyst resection on 04/11/2020.   - Pain control.  Could consider toradol if needed. May need to try different regimens to determine a regimen which controls her pain. Would also consider gabapentin. - PTOT - Begin mobilizing today.    Mareo Portilla K. Izora Ribas MD, [place] of Neuro[facility]

## 2020-04-13 NOTE — Progress Notes (Addendum)
PROGRESS NOTE   Janice Riley  UJW:[id number]    DOB: 1940/07/02    DOA: 04/12/2020  PCP: Adin Hector, MD   I have briefly reviewed patients previous medical records in [facility].  Chief Complaint  Patient presents with  . Atrial Fibrillation  . 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  Admitted for postop acute back pain with gait ataxia and acute metabolic encephalopathy.  Neurosurgery consulted.   Assessment & Plan:  Active Problems:   Back pain   Postop acute back pain, s/p left L4-5 laminforaminotomy for synovial cyst resection 11/24: Neurosurgery consultation appreciated.  I have communicated with Dr. Cari Caraway extensively.  He indicates low suspicion for an operative complication and feels like this is a pain exacerbation.  He recommends IV steroids versus Toradol, could consider adding gabapentin or Lyrica and therapies evaluation.  Monitor closely and if she does not improve then he recommends full spine MRI to look for other areas of stenosis.  Patient reportedly quite confused with PT, likely multifactorial as noted below.  Thereby made following changes: Added scheduled acetaminophen 1 g 3 times daily, IV Toradol as needed, discontinue IV Decadron, OxyIR reduced to 5 mg every 6 hourly as needed for severe pain, reduced Robaxin, discontinued as needed Ambien, continue K pad.  Monitor BMP closely while on Toradol.  Acute metabolic encephalopathy: Likely multifactorial.  Acute illness including recent  procedure under GA/intubation, postop pain, pain meds and hospital delirium complicating underlying cognitive impairment/possible dementia.  Delirium precautions.  Minimize sedatives/opioids as much as possible.  Family at bedside should help with reorientation.  No focal deficits.  Monitor closely.  UA not indicated above infection.  Hypothyroidism: Continue Synthroid.  Hypokalemia: Replaced.  Elevated blood pressures Do not see a diagnosis of hypertension and not on antihypertensives PTA.  Could be related to pain.  Monitor closely and consider as needed hydralazine for BP consistently >170/100.  Acute blood loss anemia: Due to recent procedure.  Hemoglobin stable.  Follow CBC periodically.  Leukocytosis: Likely stress response.  UA negative.  Follow CBC in a.m.  Body mass index is 23.3 kg/m.   DVT prophylaxis: enoxaparin (LOVENOX) injection 40 mg Start: 04/12/20 2200     Code Status: Full Code Family Communication: I discussed in detail with patient's son at bedside, updated care and answered all questions. Disposition:  Status is: Observation  The patient will require care spanning > 2 midnights and should be moved to inpatient because: Inpatient level of care appropriate due to severity of illness  Dispo: The patient is from: Home              Anticipated d/c is to: TBD              Anticipated d/c date is: 3 days              Patient currently is not medically stable to d/c.        Consultants:   Neurosurgery  Procedures:   None  Antimicrobials:    Anti-infectives (From admission, onward)   None  Subjective:  Interviewed patient in the presence of her son at bedside.  Patient is alert and oriented to self and place but appears confused and is an unreliable historian.  She states that she feels better, pain is better.  Rest of history per son as noted above including gait issues/instability, new altered mental status, loss of bladder  control/incontinence and pain issue only since last night.  He also stated that his father/patient spouse did mention that over the last year, patient has developed memory issues and "cognitive issues.  However he does state that current mental status is clearly not her baseline.   Objective:   Vitals:   04/13/20 0007 04/13/20 0439 04/13/20 0735 04/13/20 1209  BP: (!) 182/84 (!) 181/90 (!) 156/77 (!) 166/81  Pulse: 64 89 94 83  Resp: 16 16 17 17   Temp: 99.4 F (37.4 C) 99.1 F (37.3 C) 99.4 F (37.4 C) 98.8 F (37.1 C)  TempSrc: Oral Oral Oral Oral  SpO2: 96% 98% 100% 100%  Weight:      Height:        General exam: Pleasant elderly female, moderately built and nourished sitting up comfortably in bed without distress. Respiratory system: Clear to auscultation. Respiratory effort normal. Cardiovascular system: S1 & S2 heard, RRR. No JVD, murmurs, rubs, gallops or clicks. No pedal edema. Gastrointestinal system: Abdomen is nondistended, soft and nontender. No organomegaly or masses felt. Normal bowel sounds heard. Central nervous system: Alert and oriented x2. No focal neurological deficits. Extremities: Symmetric 5 x 5 power.  Questionable patchy sensory impairment in the legs but not confirmed given patient's AMS. Skin: No rashes, lesions or ulcers Psychiatry: Judgement and insight appear impaired. Mood & affect flat.     Data Reviewed:   I have personally reviewed following labs and imaging studies   CBC: Recent Labs  Lab 04/09/20 1408 04/12/20 1723 04/13/20 0335  WBC 7.2 12.9* 14.5*  HGB 11.9* 12.3 11.9*  HCT 36.9 37.2 36.1  MCV 92.9 93.2 91.9  PLT 280 248 176    Basic Metabolic Panel: Recent Labs  Lab 04/09/20 1408 04/12/20 1723 04/13/20 0335  NA 137 136 135  K 3.2* 3.4* 3.6  CL 107 102 101  CO2 23 23 23   GLUCOSE 89 129* 117*  BUN 17 14 9   CREATININE 0.98 0.98 0.76  CALCIUM 8.2* 8.3* 7.8*    Liver Function Tests: Recent Labs  Lab 04/12/20 1723   AST 27  ALT 16  ALKPHOS 65  BILITOT 0.7  PROT 7.3  ALBUMIN 3.9    CBG: No results for input(s): GLUCAP in the last 168 hours.  Microbiology Studies:   Recent Results (from the past 240 hour(s))  SARS CORONAVIRUS 2 (TAT 6-24 HRS) Nasopharyngeal Nasopharyngeal Swab     Status: None   Collection Time: 04/09/20  2:08 PM   Specimen: Nasopharyngeal Swab  Result Value Ref Range Status   SARS Coronavirus 2 NEGATIVE NEGATIVE Final    Comment: (NOTE) SARS-CoV-2 target nucleic acids are NOT DETECTED.  The SARS-CoV-2 RNA is generally detectable in upper and lower respiratory specimens during the acute phase of infection. Negative results do not preclude SARS-CoV-2 infection, do not rule out co-infections with other pathogens, and should not be used as the sole basis for treatment or other patient management decisions. Negative results must be combined with clinical observations, patient history, and epidemiological information. The expected result is Negative.  Fact Sheet for Patients: SugarRoll.be  Fact Sheet for Healthcare Providers: https://www.woods-mathews.com/  This test  is not yet approved or cleared by the [place] and  has been authorized for detection and/or diagnosis of SARS-CoV-2 by FDA under an Emergency Use Authorization (EUA). This EUA will remain  in effect (meaning this test can be used) for the duration of the COVID-19 declaration under Se ction 564(b)(1) of the Act, 21 U.S.C. section 360bbb-3(b)(1), unless the authorization is terminated or revoked sooner.  Performed at [facility], [place] [address] [place], [place] [address]   Surgical pcr screen     Status: None   Collection Time: 04/09/20  2:08 PM  Result Value Ref Range Status   MRSA, PCR NEGATIVE NEGATIVE Final   Staphylococcus aureus NEGATIVE NEGATIVE Final    Comment: (NOTE) The Xpert SA Assay (FDA approved for NASAL specimens in patients  27 years of age and older), is one component of a comprehensive surveillance program. It is not intended to diagnose infection nor to guide or monitor treatment. Performed at [facility], [address] [place], [place] [address]   Urine Culture     Status: Abnormal   Collection Time: 04/09/20  2:08 PM   Specimen: Urine, Random  Result Value Ref Range Status   Specimen Description   Final    URINE, RANDOM Performed at [facility], [address] [place], [place] [address]    Special Requests   Final    NONE Performed at [facility], [address] [place], [place] [address]    Culture >=100,000 COLONIES/mL ESCHERICHIA COLI (A)  Final   Report Status 04/12/2020 FINAL  Final   Organism ID, Bacteria ESCHERICHIA COLI (A)  Final      Susceptibility   Escherichia coli - MIC*    AMPICILLIN <=2 SENSITIVE Sensitive     CEFAZOLIN <=4 SENSITIVE Sensitive     CEFEPIME <=0.12 SENSITIVE Sensitive     CEFTRIAXONE <=0.25 SENSITIVE Sensitive     CIPROFLOXACIN <=0.25 SENSITIVE Sensitive     GENTAMICIN <=1 SENSITIVE Sensitive     IMIPENEM <=0.25 SENSITIVE Sensitive     NITROFURANTOIN <=16 SENSITIVE Sensitive     TRIMETH/SULFA <=20 SENSITIVE Sensitive     AMPICILLIN/SULBACTAM <=2 SENSITIVE Sensitive     PIP/TAZO <=4 SENSITIVE Sensitive     * >=100,000 COLONIES/mL ESCHERICHIA COLI  Resp Panel by RT-PCR (Flu A&B, Covid) Nasopharyngeal Swab     Status: None   Collection Time: 04/12/20  6:40 PM   Specimen: Nasopharyngeal Swab; Nasopharyngeal(NP) swabs in vial transport medium  Result Value Ref Range Status   SARS Coronavirus 2 by RT PCR NEGATIVE NEGATIVE Final    Comment: (NOTE) SARS-CoV-2 target nucleic acids are NOT DETECTED.  The SARS-CoV-2 RNA is generally detectable in upper respiratory specimens during the acute phase of infection. The lowest concentration of SARS-CoV-2 viral copies this assay can detect is 138 copies/mL. A negative result does not  preclude SARS-Cov-2 infection and should not be used as the sole basis for treatment or other patient management decisions. A negative result may occur with  improper specimen collection/handling, submission of specimen other than nasopharyngeal swab, presence of viral mutation(s) within the areas targeted by this assay, and inadequate number of viral copies(<138 copies/mL). A negative result must be combined with clinical observations, patient history, and epidemiological information. The expected result is Negative.  Fact Sheet for Patients:  EntrepreneurPulse.com.au  Fact Sheet for Healthcare Providers:  IncredibleEmployment.be  This test is no t yet approved or cleared by the [place] FDA and  has been  authorized for detection and/or diagnosis of SARS-CoV-2 by FDA under an Emergency Use Authorization (EUA). This EUA will remain  in effect (meaning this test can be used) for the duration of the COVID-19 declaration under Section 564(b)(1) of the Act, 21 U.S.C.section 360bbb-3(b)(1), unless the authorization is terminated  or revoked sooner.       Influenza A by PCR NEGATIVE NEGATIVE Final   Influenza B by PCR NEGATIVE NEGATIVE Final    Comment: (NOTE) The Xpert Xpress SARS-CoV-2/FLU/RSV plus assay is intended as an aid in the diagnosis of influenza from Nasopharyngeal swab specimens and should not be used as a sole basis for treatment. Nasal washings and aspirates are unacceptable for Xpert Xpress SARS-CoV-2/FLU/RSV testing.  Fact Sheet for Patients: EntrepreneurPulse.com.au  Fact Sheet for Healthcare Providers: IncredibleEmployment.be  This test is not yet approved or cleared by the [place] FDA and has been authorized for detection and/or diagnosis of SARS-CoV-2 by FDA under an Emergency Use Authorization (EUA). This EUA will remain in effect (meaning this test can be used) for the  duration of the COVID-19 declaration under Section 564(b)(1) of the Act, 21 U.S.C. section 360bbb-3(b)(1), unless the authorization is terminated or revoked.  Performed at [facility], [address] [place], [place] [address]      Radiology Studies:  CT Head Wo Contrast  Result Date: 04/12/2020 CLINICAL DATA:  [place].  Back surgery yesterday. EXAM: CT HEAD WITHOUT CONTRAST TECHNIQUE: Contiguous axial images were obtained from the base of the skull through the vertex without intravenous contrast. COMPARISON:  None. FINDINGS: Brain: Age related atrophy. Chronic small-vessel changes of the cerebral hemispheric white matter. No sign of acute infarction, mass lesion, hemorrhage, hydrocephalus or extra-axial collection. Ventricular size is in proportion to the degree of atrophy. Vascular: There is atherosclerotic calcification of the major vessels at the base of the brain. Skull: Negative Sinuses/Orbits: Clear/normal Other: None IMPRESSION: No acute or traumatic finding. Age related atrophy. Chronic small-vessel changes of the cerebral hemispheric white matter. Electronically Signed   By: Nelson Chimes M.D.   On: 04/12/2020 18:29   DG Chest Port 1 View  Result Date: 04/12/2020 CLINICAL DATA:  Weakness EXAM: PORTABLE CHEST 1 VIEW COMPARISON:  None FINDINGS: No focal opacity or pleural effusion. Normal cardiomediastinal silhouette with aortic atherosclerosis. No pneumothorax. Streaky atelectasis left lung base. IMPRESSION: No active disease. Streaky atelectasis left base. Electronically Signed   By: Donavan Foil M.D.   On: 04/12/2020 19:04     Scheduled Meds:   . cholecalciferol  1,000 Units Oral Daily  . dexamethasone (DECADRON) injection  4 mg Intravenous Q12H  . enoxaparin (LOVENOX) injection  40 mg Subcutaneous Q24H  . levothyroxine  100 mcg Oral Daily    Continuous Infusions:   . sodium chloride 100 mL/hr at 04/13/20 0628     LOS: 0 days     Vernell Leep, MD, [place],  [facility]. [facility]    To contact the attending provider between 7A-7P or the covering provider during after hours 7P-7A, please log into the web site [email or web address] and access using universal [place] password for that web site. If you do not have the password, please call the hospital operator.  04/13/2020, 2:15 PM

## 2020-04-13 NOTE — Plan of Care (Signed)

## 2020-04-13 NOTE — Evaluation (Signed)
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 Pt/family then noticed severe weakness and returned to hospital.    Clinical Impression   Pt was seen for OT evaluation this date. Prior to hospital admission and recent back surgery, pt was active and independent. Pt lives her spouse in a 1 story home with 3 STE. Currently pt demonstrates impairments as described below (See OT problem list) which functionally limit her ability to perform ADL/self-care tasks. Pt disoriented, requiring cues for safety, sequencing, and reassurance as pt appears anxious and easily overwhelmed during session. Able to participate fairly well in conversational speech but when confronted with direct questioning pt demonstrates difficulty with processing and memory. Pt currently requires Mod-Max A for LB ADL and ADL mobility. Upon standing from recliner with OT Max A and nurse tech +2 for IV pole mgt/safety, pt was incontinent of large amount of urine. Bed level linens/gown change and bath, requiring repeated log rolling in bed. Pt required max verbal cues and tactile cues for sequencing, often having difficulty with termination of grip in each hands when holding onto the bed rail or staff hands. Nurse tech noting pt with a temp of 101, RN notified. Pt demo'd difficulty managing medication cup to mouth, requiring OT to place 2 medications in her hand where she was able to bring to her mouth more easily and take sips of water to swallow with set up of cup. No choking/clearing throat afterwards. Pt would benefit from skilled OT services to address noted impairments and functional limitations (see below for any additional details) in order to maximize safety and independence while minimizing falls risk and caregiver  burden. Upon hospital discharge, recommend STR to maximize pt safety and return to PLOF.     Follow Up Recommendations  SNF    Equipment Recommendations  3 in 1 bedside commode    Recommendations for Other Services       Precautions / Restrictions Precautions Precautions: Fall Restrictions Weight Bearing Restrictions: No      Mobility Bed Mobility Overal bed mobility: Needs Assistance Bed Mobility: Sit to Supine;Rolling Rolling: Min assist;Mod assist   Supine to sit: Mod assist Sit to supine: Max assist   General bed mobility comments: max simple cues for sequencing, safety, hand placement, use of bed rails; appears very overwhelmed easily, anxious    Transfers Overall transfer level: Needs assistance Equipment used: Rolling walker (2 wheeled) Transfers: Sit to/from Stand Sit to Stand: Max assist;+2 safety/equipment         General transfer comment: heavy assist, +2 for safety/IV pole mgt, cues for sequencing, RW mgt    Balance Overall balance assessment: Needs assistance;History of Falls Sitting-balance support: No upper extremity supported Sitting balance-Leahy Scale: Fair     Standing balance support: Bilateral upper extremity supported Standing balance-Leahy Scale: Poor Standing balance comment: requires assist in standing, cognition impairing ability to safely use RW                           ADL either performed or assessed with clinical judgement   ADL Overall ADL's : Needs assistance/impaired  General ADL Comments: Bed level linens, gown change, bath with nurse tech 2/2 safety; pt required Mod A for rolling in bed, Max A for STS and Mod A for SPT, difficulty using RW 2/2 cognition, currently requiring Max A for LB dressing while seated EOB     Vision Patient Visual Report: No change from baseline       Perception     Praxis      Pertinent Vitals/Pain Pain Assessment:  Faces Faces Pain Scale: Hurts little more Pain Location: with bed mobility, facial grimacing, guarding Pain Descriptors / Indicators: Grimacing;Guarding Pain Intervention(s): Limited activity within patient's tolerance;Monitored during session;Repositioned     Hand Dominance     Extremity/Trunk Assessment Upper Extremity Assessment Upper Extremity Assessment: Generalized weakness;Difficult to assess due to impaired cognition (good grip during functional mobility tasks)   Lower Extremity Assessment Lower Extremity Assessment: Generalized weakness;Difficult to assess due to impaired cognition   Cervical / Trunk Assessment Cervical / Trunk Assessment: Other exceptions Cervical / Trunk Exceptions: recent back surgery   Communication Communication Communication: No difficulties   Cognition Arousal/Alertness: Awake/alert Behavior During Therapy: Anxious Overall Cognitive Status: Impaired/Different from baseline Area of Impairment: Orientation;Following commands;Safety/judgement;Awareness;Problem solving                 Orientation Level: Disoriented to;Place;Situation     Following Commands: Follows one step commands inconsistently Safety/Judgement: Decreased awareness of safety   Problem Solving: Slow processing;Difficulty sequencing;Requires verbal cues General Comments: very confused and has difficulty following commands. Does well with short commands. Becomes scared easily, easily overwhelmed   General Comments       Exercises     Shoulder Instructions      Home Living Family/patient expects to be discharged to:: Private residence Living Arrangements: Spouse/significant other Available Help at Discharge: Family;Available 24 hours/day Type of Home: House Home Access: Stairs to enter [employer] of Steps: 3 Entrance Stairs-Rails: Right Home Layout: One level (does have basement, doesn't need to access it)               Home Equipment: Cane -  single point          Prior Functioning/Environment Level of Independence: Independent        Comments: was active prior and was independent. Per spouse, has had 1 fall in last 6 months.         OT Problem List: Pain;Decreased strength;Decreased cognition;Decreased safety awareness;Decreased activity tolerance;Impaired balance (sitting and/or standing);Decreased knowledge of use of DME or AE      OT Treatment/Interventions: Self-care/ADL training;Therapeutic exercise;Therapeutic activities;DME and/or AE instruction;Cognitive remediation/compensation;Patient/family education;Balance training    OT Goals(Current goals can be found in the care plan section) Acute Rehab OT Goals Patient Stated Goal: to get stronger OT Goal Formulation: With patient/family Time For Goal Achievement: 04/27/20 Potential to Achieve Goals: Good ADL Goals Pt Will Perform Lower Body Dressing: with mod assist;sit to/from stand Pt Will Transfer to Toilet: with min assist;bedside commode;ambulating;with mod assist (LRAD for amb, MIN VC for RW mgt/safety) Additional ADL Goal #1: Pt will perform bed mobility with CGA in anticipation of seated ADL tasks EOB  OT Frequency: Min 1X/week   Barriers to D/C:            Co-evaluation              AM-PAC OT "6 Clicks" Daily Activity     Outcome Measure Help from another person eating meals?: None Help from another person taking care of personal grooming?: A Little  Help from another person toileting, which includes using toliet, bedpan, or urinal?: A Lot Help from another person bathing (including washing, rinsing, drying)?: A Lot Help from another person to put on and taking off regular upper body clothing?: A Little Help from another person to put on and taking off regular lower body clothing?: A Lot 6 Click Score: 16   End of Session Equipment Utilized During Treatment: Gait belt;Rolling walker  Activity Tolerance: Patient tolerated treatment  well;Other (comment) (cognition/confusion impairing session) Patient left: in bed;with call bell/phone within reach;with bed alarm set;with family/visitor present  OT Visit Diagnosis: Other abnormalities of gait and mobility (R26.89);Muscle weakness (generalized) (M62.81);Other symptoms and signs involving cognitive function                Time: 1410-3013 OT Time Calculation (min): 33 min Charges:  OT General Charges $OT Visit: 1 Visit OT Evaluation $OT Eval High Complexity: 1 High OT Treatments $Self Care/Home Management : 8-22 mins  Jeni Salles, MPH, MS, OTR/L ascom [phone] 04/13/20, 4:33 PM

## 2020-04-13 NOTE — Evaluation (Signed)
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 Pt/family then noticed severe weakness and returned to hospital.   Clinical Impression  Pt is a pleasant 79 year old female who was admitted for back pain following recent surgery. Pt performs bed mobility with mod assist and transfers with max assist. Unable to fully stand with RW, performed SPT to recliner. Unable to ambulate at this time due to deficits. Pt reports numbness in B feet (L>R), however unable to quantify due to cognition. Pt demonstrates deficits with strength/balance/mobility. Is not at baseline at this time. Would benefit from skilled PT to address above deficits and promote optimal return to PLOF; recommend transition to STR upon discharge from acute hospitalization.     Follow Up Recommendations SNF    Equipment Recommendations  Rolling walker with 5" wheels    Recommendations for Other Services       Precautions / Restrictions Precautions Precautions: Fall Restrictions Weight Bearing Restrictions: No      Mobility  Bed Mobility Overal bed mobility: Needs Assistance Bed Mobility: Supine to Sit     Supine to sit: Mod assist     General bed mobility comments: need assist to initiate transfer to EOB. Once seated at EOB, needs assist for scooting out towards EOB. Able to sit with upright posture    Transfers Overall transfer level: Needs assistance Equipment used: Rolling walker (2 wheeled) Transfers: Sit to/from Stand Sit to Stand: Max assist         General transfer comment: attempts made to stand with RW, unable to lift buttock off bed. Cognition limiting attempts. This therapist then performed stand pivot transfer to recliner. Pt tolerated well with max assist  Ambulation/Gait              General Gait Details: unable  Stairs            Wheelchair Mobility    Modified Rankin (Stroke Patients Only)       Balance Overall balance assessment: Needs assistance;History of Falls Sitting-balance support: No upper extremity supported Sitting balance-Leahy Scale: Fair     Standing balance support: Bilateral upper extremity supported Standing balance-Leahy Scale: Poor                               Pertinent Vitals/Pain Pain Assessment: No/denies pain (although does grimace in pain with mobility)    Home Living Family/patient expects to be discharged to:: Private residence Living Arrangements: Spouse/significant other Available Help at Discharge: Family;Available 24 hours/day Type of Home: House Home Access: Stairs to enter Entrance Stairs-Rails: Right Entrance Stairs-Number of Steps: 3 Home Layout: One level (does have basement, doesn't need to access it) Home Equipment: Cane - single point      Prior Function Level of Independence: Independent         Comments: was active prior and was independent. Per spouse, has had 1 fall in last 6 months.      Hand Dominance        Extremity/Trunk Assessment   Upper Extremity Assessment Upper Extremity Assessment: Generalized weakness;Difficult to assess due to impaired cognition (able to hold remote in R hand; unable in L)    Lower Extremity Assessment Lower Extremity Assessment: Generalized weakness;Difficult to assess due to impaired cognition (  Appears grossly 3-/5)       Communication   Communication: No difficulties  Cognition Arousal/Alertness: Awake/alert Behavior During Therapy: WFL for tasks assessed/performed Overall Cognitive Status: Impaired/Different from baseline Area of Impairment: Orientation;Following commands;Safety/judgement;Awareness;Problem solving                 Orientation Level: Disoriented to;Place;Situation     Following Commands: Follows one  step commands inconsistently Safety/Judgement: Decreased awareness of safety   Problem Solving: Slow processing;Difficulty sequencing;Requires verbal cues General Comments: very confused and has difficulty following commands. Does well with short commands. Becomes scared easily      General Comments      Exercises     Assessment/Plan    PT Assessment Patient needs continued PT services  PT Problem List Decreased strength;Decreased activity tolerance;Decreased balance;Decreased mobility;Decreased cognition;Decreased knowledge of use of DME;Decreased safety awareness;Impaired sensation       PT Treatment Interventions DME instruction;Gait training;Stair training;Therapeutic exercise;Balance training    PT Goals (Current goals can be found in the Care Plan section)  Acute Rehab PT Goals Patient Stated Goal: to get stronger PT Goal Formulation: With patient Time For Goal Achievement: 04/27/20 Potential to Achieve Goals: Good    Frequency Min 2X/week   Barriers to discharge        Co-evaluation               AM-PAC PT "6 Clicks" Mobility  Outcome Measure Help needed turning from your back to your side while in a flat bed without using bedrails?: A Little Help needed moving from lying on your back to sitting on the side of a flat bed without using bedrails?: A Little Help needed moving to and from a bed to a chair (including a wheelchair)?: A Lot Help needed standing up from a chair using your arms (e.g., wheelchair or bedside chair)?: A Lot Help needed to walk in hospital room?: Total Help needed climbing 3-5 steps with a railing? : Total 6 Click Score: 12    End of Session Equipment Utilized During Treatment: Gait belt Activity Tolerance: Patient tolerated treatment well Patient left: in chair;with chair alarm set;with family/visitor present Nurse Communication: Mobility status PT Visit Diagnosis: Unsteadiness on feet (R26.81);Muscle weakness (generalized)  (M62.81);History of falling (Z91.81);Difficulty in walking, not elsewhere classified (R26.2)    Time: 4742-5956 PT Time Calculation (min) (ACUTE ONLY): 38 min   Charges:   PT Evaluation $PT Eval Moderate Complexity: 1 Mod PT Treatments $Therapeutic Activity: 23-37 mins        Greggory Stallion, PT, DPT [phone]   Imogine Carvell 04/13/2020, 4:10 PM

## 2020-04-14 DIAGNOSIS — G8918 Other acute postprocedural pain: Secondary | ICD-10-CM | POA: Diagnosis not present

## 2020-04-14 DIAGNOSIS — G9341 Metabolic encephalopathy: Secondary | ICD-10-CM | POA: Diagnosis not present

## 2020-04-14 LAB — BASIC METABOLIC PANEL
Anion gap: 9 (ref 5–15)
BUN: 16 mg/dL (ref 8–23)
CO2: 24 mmol/L (ref 22–32)
Calcium: 7.6 mg/dL — ABNORMAL LOW (ref 8.9–10.3)
Chloride: 102 mmol/L (ref 98–111)
Creatinine, Ser: 0.8 mg/dL (ref 0.44–1.00)
GFR, Estimated: >60 mL/min (ref >60)
Glucose, Bld: 106 mg/dL — ABNORMAL HIGH (ref 70–99)
Potassium: 3.3 mmol/L — ABNORMAL LOW (ref 3.5–5.1)
Sodium: 135 mmol/L (ref 135–145)

## 2020-04-14 LAB — CBC
HCT: 32.9 % — ABNORMAL LOW (ref 36.0–46.0)
Hemoglobin: 11.2 g/dL — ABNORMAL LOW (ref 12.0–15.0)
MCH: 31.1 pg (ref 26.0–34.0)
MCHC: 34 g/dL (ref 30.0–36.0)
MCV: 91.4 fL (ref 80.0–100.0)
Platelets: 229 10*3/uL (ref 150–400)
RBC: 3.6 MIL/uL — ABNORMAL LOW (ref 3.87–5.11)
RDW: 14.3 % (ref 11.5–15.5)
WBC: 9.8 10*3/uL (ref 4.0–10.5)
nRBC: 0 % (ref 0.0–0.2)

## 2020-04-14 MED ORDER — POTASSIUM CHLORIDE CRYS ER 20 MEQ PO TBCR
40.0000 meq | EXTENDED_RELEASE_TABLET | Freq: Once | ORAL | Status: AC
  Administered 2020-04-14: 40 meq via ORAL
  Filled 2020-04-14: qty 2

## 2020-04-14 MED ORDER — BISACODYL 10 MG RE SUPP: 10.0000 mg | Freq: Every day | RECTAL | Status: DC | PRN

## 2020-04-14 NOTE — Progress Notes (Signed)
    Attending Progress Note  History: Janice Riley is here for AMS and difficulty at home. She suffered from delirium over the past 2 days.  She is much clearer today.  She describes pain in her lower back.   Physical Exam: Vitals:   04/14/20 0400 04/14/20 0735  BP: (!) 159/80 (!) 130/55  Pulse: 85 86  Resp: 16 16  Temp: 98.3 F (36.8 C) 98 F (36.7 C)  SpO2: 98% 100%    AA Ox3 CNI  Strength:5/5 throughout BLe  Data:  Recent Labs  Lab 04/12/20 1723 04/12/20 1723 04/13/20 0335 04/13/20 0335 04/14/20 0404  NA 136   < > 135   < > 135  K 3.4*   < > 3.6   < > 3.3*  CL 102   < > 101   < > 102  CO2 23   < > 23   < > 24  BUN 14   < > 9   < > 16  CREATININE 0.98   < > 0.76   < > 0.80  GLUCOSE 129*   < > 117*   < > 106*  CALCIUM 8.3*  --  7.8*  --  7.6*   < > = values in this interval not displayed.   Recent Labs  Lab 04/12/20 1723  AST 27  ALT 16  ALKPHOS 65     Recent Labs  Lab 04/12/20 1723 04/12/20 1723 04/13/20 0335 04/13/20 0335 04/14/20 0404  WBC 12.9*   < > 14.5*   < > 9.8  HGB 12.3   < > 11.9*   < > 11.2*  HCT 37.2   < > 36.1   < > 32.9*  PLT 248  --  258  --  229   < > = values in this interval not displayed.   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  - mobilize - pain control - DVT prophylaxis - PTOT - other care per internist  Meade Maw MD, [facility] Department of Neurosurgery

## 2020-04-14 NOTE — Progress Notes (Signed)
PROGRESS NOTE   Janice Riley  KZS:[id number]    DOB: May 17, 1941    DOA: 04/12/2020  PCP: Adin Hector, MD   I have briefly reviewed patients previous medical records in [facility].  Chief Complaint  Patient presents with  . Atrial Fibrillation  . 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  Admitted for postop acute back pain with gait ataxia and acute metabolic encephalopathy.  Neurosurgery consulted.  Mental status improving.  Had low-grade fever overnight, monitor.   Assessment & Plan:  Active Problems:   Back pain   Acute back pain   Postoperative pain after spinal surgery   Acute metabolic encephalopathy   Postop acute back pain, s/p left L4-5 laminforaminotomy for synovial cyst resection 11/24: Neurosurgery consultation appreciated > low suspicion for an operative complication and feels like this is a pain exacerbation.  Neurosurgery recommended IV steroids versus Toradol, could consider adding gabapentin or Lyrica and therapies evaluation.  Monitor closely and if she does not improve then he recommends full spine MRI to look for other areas of stenosis.  Due to mental status changes, discontinued IV steroids, minimized opioids.  Pain better.  Continue to mobilize and evaluation with therapies.  Still having urinary incontinence, unclear if it is related to her AMS versus pain and neurosurgery does not feel that this is neurological.  Acute metabolic encephalopathy: Likely multifactorial.  Acute illness  including recent procedure under GA/intubation, postop pain, pain meds and hospital delirium complicating underlying cognitive impairment/possible dementia.  Delirium precautions.  Minimized sedatives/opioids as much as possible.  Family at bedside should help with reorientation.  No focal deficits.  Monitor closely.  UA not indicated above infection.  Definitely better today than yesterday which was confirmed by spouse at bedside.  However still not at baseline.  Continue current measures.  Fever: Overnight had fever of 101 F.  No clear source per history or physical exam.  Urine culture on previous admission showed E. coli but urine microscopy this admission not indicated of UTI.  Chest x-ray negative.  Postop wound without acute findings.  Leukocytosis resolved.  If develops recurrent fevers, plan blood culture and consider ceftriaxone.  Hypothyroidism: Continue Synthroid.  Hypokalemia: Replace and follow.  Elevated blood pressures Do not see a diagnosis of hypertension and not on antihypertensives PTA.  Could be related to pain.  Monitor closely and consider as needed hydralazine for BP consistently >170/100.  Better.  Acute blood loss anemia: Due to recent procedure.  Hemoglobin stable.  Follow CBC periodically.  Leukocytosis: Likely stress response.  UA negative.  Resolved.  Body mass index is 23.3 kg/m.   DVT prophylaxis: enoxaparin (LOVENOX) injection 40 mg Start: 04/12/20 2200     Code Status: Full Code Family Communication: I discussed in detail with patient's spouse at bedside, updated care and answered all questions. Disposition:  Status is: Inpatient  The patient will require care spanning > 2 midnights and should be moved to inpatient because: Inpatient level of care appropriate due to severity of illness  Dispo: The patient is from: Home  Anticipated d/c is to: SNF              Anticipated d/c date is: 3 days              Patient currently is not  medically stable to d/c.        Consultants:   Neurosurgery  Procedures:   None  Antimicrobials:    Anti-infectives (From admission, onward)   None        Subjective:  Patient interviewed and examined along with spouse at bedside.  She looks much better and is more clear per.  Indicates low back pain at surgery site, better compared to yesterday.  Would like to have a BM-has laxative ordered.  Denies dysuria or urinary frequency.  As per spouse, mental status significantly improved compared to yesterday but not completely resolved.  Not eating or drinking much.  Still has urinary incontinence.  Spouse and son are taking turns to be with patient.   Objective:   Vitals:   04/13/20 2052 04/14/20 0400 04/14/20 0735 04/14/20 1148  BP: (!) 144/83 (!) 159/80 (!) 130/55 (!) 126/58  Pulse: 83 85 86 89  Resp:  16 16 16   Temp: 98.1 F (36.7 C) 98.3 F (36.8 C) 98 F (36.7 C) 98.6 F (37 C)  TempSrc: Oral Oral Oral Oral  SpO2: 99% 98% 100% 98%  Weight:      Height:        General exam: Pleasant elderly female, moderately built and nourished sitting up comfortably in bed without distress.  Oral mucosa moist.  Looks much improved compared to yesterday.  Sitting up in drinking out of a cup. Respiratory system: Clear to auscultation. Respiratory effort normal. Cardiovascular system: S1 & S2 heard, RRR. No JVD, murmurs, rubs, gallops or clicks. No pedal edema. Gastrointestinal system: Abdomen is nondistended, soft and nontender. No organomegaly or masses felt. Normal bowel sounds heard. Central nervous system: Alert and oriented x person, place, time, president. No focal neurological deficits. Extremities: Symmetric 5 x 5 power.  Symmetric 5 x 5 power. Skin: No rashes, lesions or ulcers.  Mid lumbar spine surgical site without acute findings, no redness, drainage or tenderness.  Sutures intact. Psychiatry: Judgement and insight much improved but still somewhat impaired. Mood & affect  pleasant and appropriate.    Data Reviewed:   I have personally reviewed following labs and imaging studies   CBC: Recent Labs  Lab 04/12/20 1723 04/13/20 0335 04/14/20 0404  WBC 12.9* 14.5* 9.8  HGB 12.3 11.9* 11.2*  HCT 37.2 36.1 32.9*  MCV 93.2 91.9 91.4  PLT 248 258 629    Basic Metabolic Panel: Recent Labs  Lab 04/12/20 1723 04/13/20 0335 04/14/20 0404  NA 136 135 135  K 3.4* 3.6 3.3*  CL 102 101 102  CO2 23 23 24   GLUCOSE 129* 117* 106*  BUN 14 9 16   CREATININE 0.98 0.76 0.80  CALCIUM 8.3* 7.8* 7.6*    Liver Function Tests: Recent Labs  Lab 04/12/20 1723  AST 27  ALT 16  ALKPHOS 65  BILITOT 0.7  PROT 7.3  ALBUMIN 3.9    CBG: No results for input(s): GLUCAP in the last 168 hours.  Microbiology Studies:   Recent Results (from the past 240 hour(s))  SARS CORONAVIRUS 2 (TAT 6-24 HRS) Nasopharyngeal Nasopharyngeal Swab     Status: None   Collection Time: 04/09/20  2:08 PM   Specimen: Nasopharyngeal Swab  Result Value Ref Range Status   SARS Coronavirus 2  NEGATIVE NEGATIVE Final    Comment: (NOTE) SARS-CoV-2 target nucleic acids are NOT DETECTED.  The SARS-CoV-2 RNA is generally detectable in upper and lower respiratory specimens during the acute phase of infection. Negative results do not preclude SARS-CoV-2 infection, do not rule out co-infections with other pathogens, and should not be used as the sole basis for treatment or other patient management decisions. Negative results must be combined with clinical observations, patient history, and epidemiological information. The expected result is Negative.  Fact Sheet for Patients: SugarRoll.be  Fact Sheet for Healthcare Providers: https://www.woods-mathews.com/  This test is not yet approved or cleared by the [place] FDA and  has been authorized for detection and/or diagnosis of SARS-CoV-2 by FDA under an Emergency Use Authorization (EUA).  This EUA will remain  in effect (meaning this test can be used) for the duration of the COVID-19 declaration under Se ction 564(b)(1) of the Act, 21 U.S.C. section 360bbb-3(b)(1), unless the authorization is terminated or revoked sooner.  Performed at [facility], [place] [address], [place], [place] [address]   Surgical pcr screen     Status: None   Collection Time: 04/09/20  2:08 PM  Result Value Ref Range Status   MRSA, PCR NEGATIVE NEGATIVE Final   Staphylococcus aureus NEGATIVE NEGATIVE Final    Comment: (NOTE) The Xpert SA Assay (FDA approved for NASAL specimens in patients 65 years of age and older), is one component of a comprehensive surveillance program. It is not intended to diagnose infection nor to guide or monitor treatment. Performed at [facility], [address] [place], [place] [address]   Urine Culture     Status: Abnormal   Collection Time: 04/09/20  2:08 PM   Specimen: Urine, Random  Result Value Ref Range Status   Specimen Description   Final    URINE, RANDOM Performed at [facility], [address] [place], [place] [address]    Special Requests   Final    NONE Performed at [facility], [address] [place],  [address]    Culture >=100,000 COLONIES/mL ESCHERICHIA COLI (A)  Final   Report Status 04/12/2020 FINAL  Final   Organism ID, Bacteria ESCHERICHIA COLI (A)  Final      Susceptibility   Escherichia coli - MIC*    AMPICILLIN <=2 SENSITIVE Sensitive     CEFAZOLIN <=4 SENSITIVE Sensitive     CEFEPIME <=0.12 SENSITIVE Sensitive     CEFTRIAXONE <=0.25 SENSITIVE Sensitive     CIPROFLOXACIN <=0.25 SENSITIVE Sensitive     GENTAMICIN <=1 SENSITIVE Sensitive     IMIPENEM <=0.25 SENSITIVE Sensitive     NITROFURANTOIN <=16 SENSITIVE Sensitive     TRIMETH/SULFA <=20 SENSITIVE Sensitive     AMPICILLIN/SULBACTAM <=2 SENSITIVE Sensitive     PIP/TAZO <=4 SENSITIVE Sensitive     * >=100,000 COLONIES/mL  ESCHERICHIA COLI  Resp Panel by RT-PCR (Flu A&B, Covid) Nasopharyngeal Swab     Status: None   Collection Time: 04/12/20  6:40 PM   Specimen: Nasopharyngeal Swab; Nasopharyngeal(NP) swabs in vial transport medium  Result Value Ref Range Status   SARS Coronavirus 2 by RT PCR NEGATIVE NEGATIVE Final    Comment: (NOTE) SARS-CoV-2 target nucleic acids are NOT DETECTED.  The SARS-CoV-2 RNA is generally detectable in upper respiratory specimens during the acute phase of infection. The lowest concentration of SARS-CoV-2 viral copies this assay can detect is 138 copies/mL. A negative result does not preclude SARS-Cov-2 infection and should not be used  as the sole basis for treatment or other patient management decisions. A negative result may occur with  improper specimen collection/handling, submission of specimen other than nasopharyngeal swab, presence of viral mutation(s) within the areas targeted by this assay, and inadequate number of viral copies(<138 copies/mL). A negative result must be combined with clinical observations, patient history, and epidemiological information. The expected result is Negative.  Fact Sheet for Patients:  EntrepreneurPulse.com.au  Fact Sheet for Healthcare Providers:  IncredibleEmployment.be  This test is no t yet approved or cleared by the [place] FDA and  has been authorized for detection and/or diagnosis of SARS-CoV-2 by FDA under an Emergency Use Authorization (EUA). This EUA will remain  in effect (meaning this test can be used) for the duration of the COVID-19 declaration under Section 564(b)(1) of the Act, 21 U.S.C.section 360bbb-3(b)(1), unless the authorization is terminated  or revoked sooner.       Influenza A by PCR NEGATIVE NEGATIVE Final   Influenza B by PCR NEGATIVE NEGATIVE Final    Comment: (NOTE) The Xpert Xpress SARS-CoV-2/FLU/RSV plus assay is intended as an aid in the diagnosis of  influenza from Nasopharyngeal swab specimens and should not be used as a sole basis for treatment. Nasal washings and aspirates are unacceptable for Xpert Xpress SARS-CoV-2/FLU/RSV testing.  Fact Sheet for Patients: EntrepreneurPulse.com.au  Fact Sheet for Healthcare Providers: IncredibleEmployment.be  This test is not yet approved or cleared by the [place] FDA and has been authorized for detection and/or diagnosis of SARS-CoV-2 by FDA under an Emergency Use Authorization (EUA). This EUA will remain in effect (meaning this test can be used) for the duration of the COVID-19 declaration under Section 564(b)(1) of the Act, 21 U.S.C. section 360bbb-3(b)(1), unless the authorization is terminated or revoked.  Performed at [facility], [address], [place],  [address]      Radiology Studies:  CT Head Wo Contrast  Result Date: 04/12/2020 CLINICAL DATA:  [place].  Back surgery yesterday. EXAM: CT HEAD WITHOUT CONTRAST TECHNIQUE: Contiguous axial images were obtained from the base of the skull through the vertex without intravenous contrast. COMPARISON:  None. FINDINGS: Brain: Age related atrophy. Chronic small-vessel changes of the cerebral hemispheric white matter. No sign of acute infarction, mass lesion, hemorrhage, hydrocephalus or extra-axial collection. Ventricular size is in proportion to the degree of atrophy. Vascular: There is atherosclerotic calcification of the major vessels at the base of the brain. Skull: Negative Sinuses/Orbits: Clear/normal Other: None IMPRESSION: No acute or traumatic finding. Age related atrophy. Chronic small-vessel changes of the cerebral hemispheric white matter. Electronically Signed   By: Nelson Chimes M.D.   On: 04/12/2020 18:29   DG Chest Port 1 View  Result Date: 04/12/2020 CLINICAL DATA:  Weakness EXAM: PORTABLE CHEST 1 VIEW COMPARISON:  None FINDINGS: No focal opacity or pleural effusion.  Normal cardiomediastinal silhouette with aortic atherosclerosis. No pneumothorax. Streaky atelectasis left lung base. IMPRESSION: No active disease. Streaky atelectasis left base. Electronically Signed   By: Donavan Foil M.D.   On: 04/12/2020 19:04     Scheduled Meds:   . acetaminophen  1,000 mg Oral TID  . cholecalciferol  1,000 Units Oral Daily  . enoxaparin (LOVENOX) injection  40 mg Subcutaneous Q24H  . levothyroxine  100 mcg Oral Daily  . polyethylene glycol  17 g Oral Daily    Continuous Infusions:   . sodium chloride 10 mL/hr at 04/13/20 1520     LOS: 1 day     Vernell Leep, MD,  FACP, SFHM. [facility]    To contact the attending provider between 7A-7P or the covering provider during after hours 7P-7A, please log into the web site [email or web address] and access using universal [place] password for that web site. If you do not have the password, please call the hospital operator.  04/14/2020, 12:18 PM

## 2020-04-14 NOTE — Plan of Care (Signed)

## 2020-04-14 NOTE — Progress Notes (Signed)
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 Pt/family then noticed severe weakness and returned to hospital.    OT comments  Pt family member in hallway requesting assistance for pt stating she is getting out of bed to use toilet - pt did not press call bell and bed alarm set to OOB. Upon arrival pt laying supine with BLE out of bed and unable to elevate torso. MOD A given to exit L side of bed. MAX A for SPT bed<>BSC and SBA perihygiene in sitting. Pt noted difficulty sequencing transfer and requires  increased time/repetition of cues. Pt left with family and RN in room, bed alarm set and call bell in reach. Instructed on using call bell for assistance. Pt making progress toward goals. Pt continues to benefit from skilled OT services to maximize return to PLOF and minimize risk of future falls, injury, caregiver burden, and readmission. Will continue to follow POC. Discharge recommendation remains appropriate.    Follow Up Recommendations  SNF    Equipment Recommendations  3 in 1 bedside commode    Recommendations for Other Services      Precautions / Restrictions Precautions Precautions: Fall Restrictions Weight Bearing Restrictions: No       Mobility Bed Mobility Overal bed mobility: Needs Assistance Bed Mobility: Supine to Sit;Sit to Supine     Supine to sit: Mod assist Sit to supine: Min assist      Transfers Overall transfer level: Needs assistance   Transfers: Sit to/from Stand;Stand Pivot Transfers Sit to Stand: Mod assist Stand pivot transfers: Max assist       General transfer comment: assist to sequence transfer    Balance Overall balance assessment: Needs assistance;History of Falls Sitting-balance support: Bilateral upper extremity  supported Sitting balance-Leahy Scale: Poor Sitting balance - Comments: progressive posterior lean - unable to self correct Postural control: Posterior lean Standing balance support: Bilateral upper extremity supported Standing balance-Leahy Scale: Poor            ADL either performed or assessed with clinical judgement   ADL Overall ADL's : Needs assistance/impaired      General ADL Comments: MAX A for SPT bed<>BSCSBA perihygiene in sitting.                Cognition Arousal/Alertness: Awake/alert Behavior During Therapy: Anxious Overall Cognitive Status: Impaired/Different from baseline Area of Impairment: Memory;Following commands;Safety/judgement;Problem solving            Memory: Decreased recall of precautions Following Commands: Follows one step commands with increased time Safety/Judgement: Decreased awareness of safety   Problem Solving: Slow processing;Difficulty sequencing;Requires verbal cues General Comments: Requires increased time/repetition for sequencing transfer and safety awareness        Exercises Exercises: Other exercises Other Exercises Other Exercises: Pt and family educated re: importance of using call bell, safe t/f technique, DME recs, and falls prevention Other Exercises: Toileting, sup<>sit, sit<>stand, SPT, sitting/standing balance/tolerance           Pertinent Vitals/ Pain       Pain Assessment: Faces Faces Pain Scale: Hurts little more Pain Location: with bed mobility, facial grimacing, guarding Pain Descriptors / Indicators: Grimacing;Guarding Pain Intervention(s): Limited activity within patient's tolerance;Repositioned         Frequency  Min 1X/week        Progress  Toward Goals  OT Goals(current goals can now be found in the care plan section)  Progress towards OT goals: Progressing toward goals  Acute Rehab OT Goals Patient Stated Goal: to get stronger OT Goal Formulation: With patient/family Time For Goal  Achievement: 04/27/20 Potential to Achieve Goals: Good ADL Goals Pt Will Perform Lower Body Dressing: with mod assist;sit to/from stand Pt Will Transfer to Toilet: with min assist;bedside commode;ambulating;with mod assist (LRAD for amb, MIN VC for RW mgt/safety) Additional ADL Goal #1: Pt will perform bed mobility with CGA in anticipation of seated ADL tasks EOB  Plan Discharge plan remains appropriate;Frequency remains appropriate       AM-PAC OT "6 Clicks" Daily Activity     Outcome Measure   Help from another person eating meals?: None Help from another person taking care of personal grooming?: A Little Help from another person toileting, which includes using toliet, bedpan, or urinal?: A Lot Help from another person bathing (including washing, rinsing, drying)?: A Lot Help from another person to put on and taking off regular upper body clothing?: A Little Help from another person to put on and taking off regular lower body clothing?: A Lot 6 Click Score: 16    End of Session    OT Visit Diagnosis: Other abnormalities of gait and mobility (R26.89);Muscle weakness (generalized) (M62.81);Other symptoms and signs involving cognitive function   Activity Tolerance Patient tolerated treatment well   Patient Left in bed;with call bell/phone within reach;with bed alarm set;with nursing/sitter in room;with family/visitor present   Nurse Communication Mobility status        Time: 7897-8478 OT Time Calculation (min): 13 min  Charges: OT General Charges $OT Visit: 1 Visit OT Treatments $Self Care/Home Management : 8-22 mins  Dessie Coma, M.S. OTR/L  04/14/20, 10:53 AM  ascom [phone]

## 2020-04-14 NOTE — Plan of Care (Signed)
?  Problem: Education: ?Goal: Knowledge of General Education information will improve ?Description: Including pain rating scale, medication(s)/side effects and non-pharmacologic comfort measures ?Outcome: Progressing ?  ?Problem: Health Behavior/Discharge Planning: ?Goal: Ability to manage health-related needs will improve ?Outcome: Progressing ?  ?Problem: Clinical Measurements: ?Goal: Ability to maintain clinical measurements within normal limits will improve ?Outcome: Progressing ?Goal: Will remain free from infection ?Outcome: Progressing ?Goal: Diagnostic test results will improve ?Outcome: Progressing ?Goal: Respiratory complications will improve ?Outcome: Progressing ?Goal: Cardiovascular complication will be avoided ?Outcome: Progressing ?  ?Problem: Activity: ?Goal: Risk for activity intolerance will decrease ?Outcome: Progressing ?  ?Problem: Safety: ?Goal: Ability to remain free from injury will improve ?Outcome: Progressing ?  ?

## 2020-04-15 DIAGNOSIS — G9341 Metabolic encephalopathy: Secondary | ICD-10-CM | POA: Diagnosis not present

## 2020-04-15 DIAGNOSIS — G8918 Other acute postprocedural pain: Secondary | ICD-10-CM | POA: Diagnosis not present

## 2020-04-15 LAB — BASIC METABOLIC PANEL
Anion gap: 9 (ref 5–15)
BUN: 15 mg/dL (ref 8–23)
CO2: 22 mmol/L (ref 22–32)
Calcium: 8.3 mg/dL — ABNORMAL LOW (ref 8.9–10.3)
Chloride: 103 mmol/L (ref 98–111)
Creatinine, Ser: 0.87 mg/dL (ref 0.44–1.00)
GFR, Estimated: >60 mL/min (ref >60)
Glucose, Bld: 108 mg/dL — ABNORMAL HIGH (ref 70–99)
Potassium: 3.7 mmol/L (ref 3.5–5.1)
Sodium: 134 mmol/L — ABNORMAL LOW (ref 135–145)

## 2020-04-15 NOTE — NC FL2 (Signed)
[place] LEVEL OF CARE SCREENING TOOL     IDENTIFICATION  Patient Name: Janice Riley Birthdate: November 23, 1940 Sex: female Admission Date (Current Location): 04/12/2020  [place] and [place] Number:  Engineering geologist and Address:  [facility], [address], [place], [place] [address]      Provider Number: [id number]  Attending Physician Name and Address:  Modena Jansky, MD  Relative Name and Phone Number:  Juanda Crumble [phone]    Current Level of Care: Hospital Recommended Level of Care: [place] Prior Approval Number:    Date Approved/Denied:   PASRR Number: [id number]  Discharge Plan: SNF    Current Diagnoses: Patient Active Problem List   Diagnosis Date Noted  . Acute back pain 04/13/2020  . Postoperative pain after spinal surgery   . Acute metabolic encephalopathy   . Back pain 04/12/2020  . Osteoporosis 02/04/2018  . 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    Orientation RESPIRATION BLADDER Height & Weight     Self, Time, Place, Situation  Normal Continent Weight: 140 lb (63.5 kg) Height:  5\' 5"  (165.1 cm)  BEHAVIORAL SYMPTOMS/MOOD NEUROLOGICAL BOWEL NUTRITION STATUS      Continent Diet  AMBULATORY STATUS COMMUNICATION OF NEEDS Skin   Extensive Assist Verbally Normal                       Personal Care Assistance Level of Assistance  Bathing, Feeding, Dressing Bathing Assistance: Limited assistance Feeding assistance: Limited assistance Dressing Assistance: Maximum assistance     Functional Limitations Info  Sight, Hearing, Speech Sight Info: Adequate Hearing Info: Adequate Speech Info: Adequate    SPECIAL CARE FACTORS FREQUENCY  PT (By licensed PT), OT (By licensed OT)     PT Frequency: 5x week OT Frequency: 5x week            Contractures Contractures Info: Not present    Additional Factors Info  Code Status, Allergies Code Status Info:  Full Allergies Info: Tamoxifen           Current Medications (04/15/2020):  This is the current hospital active medication list Current Facility-Administered Medications  Medication Dose Route Frequency Provider Last Rate Last Admin  . 0.9 %  sodium chloride infusion   Intravenous Continuous Modena Jansky, MD 10 mL/hr at 04/13/20 1520 Rate Change at 04/13/20 1520  . acetaminophen (TYLENOL) tablet 1,000 mg  1,000 mg Oral TID Modena Jansky, MD   1,000 mg at 04/15/20 0945  . bisacodyl (DULCOLAX) suppository 10 mg  10 mg Rectal Daily PRN Modena Jansky, MD      . cholecalciferol (VITAMIN D3) tablet 1,000 Units  1,000 Units Oral Daily Mansy, Arvella Merles, MD   1,000 Units at 04/15/20 0945  . enoxaparin (LOVENOX) injection 40 mg  40 mg Subcutaneous Q24H Mansy, Jan A, MD   40 mg at 04/13/20 2100  . levothyroxine (SYNTHROID) tablet 100 mcg  100 mcg Oral Daily Mansy, Jan A, MD   100 mcg at 04/15/20 0540  . magnesium hydroxide (MILK OF MAGNESIA) suspension 30 mL  30 mL Oral Daily PRN Mansy, Jan A, MD      . methocarbamol (ROBAXIN) tablet 500 mg  500 mg Oral Q8H PRN Modena Jansky, MD      . ondansetron (ZOFRAN) tablet 4 mg  4 mg Oral Q6H PRN Mansy, Arvella Merles, MD       Or  . ondansetron [facility]) injection 4  mg  4 mg Intravenous Q6H PRN Mansy, Jan A, MD      . oxyCODONE (Oxy IR/ROXICODONE) immediate release tablet 5 mg  5 mg Oral Q6H PRN Modena Jansky, MD   5 mg at 04/15/20 0540  . polyethylene glycol (MIRALAX / GLYCOLAX) packet 17 g  17 g Oral Daily Modena Jansky, MD   17 g at 04/15/20 0945     Discharge Medications: Please see discharge summary for a list of discharge medications.  Relevant Imaging Results:  Relevant Lab Results:   Additional Information SS: [id number]  Boris Sharper, LCSW

## 2020-04-15 NOTE — TOC Initial Note (Signed)
Transition of Care [facility]) - Initial/Assessment Note    Patient Details  Name: Beatris Belen MRN: [id number] Date of Birth: 04-22-1941  [facility] CM/SW Contact:    Boris Sharper, LCSW Phone Number: 04/15/2020, 3:10 PM  Clinical Narrative:                 CSW spoke with pt and her spouse about discharge plans as well as PT recommendations. CSW explained the process and answered all questions. CSW provided a lot of facilitid from medicare.gov and pt and spouse agreed their top choice was [facility]. CSW explained the difficulty of getting into [facility] and they were understanding and agreed for the referral to be sent out to other facilities as well. CSW completed FL2 and PASRR and faxed to surrounding facilities.   Expected Discharge Plan: Skilled Nursing Facility Barriers to Discharge: Continued Medical Work up   Patient Goals and CMS Choice        Expected Discharge Plan and Services Expected Discharge Plan: [place] arrangements for the past 2 months: Single Family Home                                      Prior Living Arrangements/Services Living arrangements for the past 2 months: Single Family Home Lives with:: Spouse Patient language and need for interpreter reviewed:: Yes        Need for Family Participation in Patient Care: Yes (Comment) Care giver support system in place?: Yes (comment) (spouse)   Criminal Activity/Legal Involvement Pertinent to Current Situation/Hospitalization: No - Comment as needed  Activities of Daily Living Home Assistive Devices/Equipment: Cane (specify quad or straight), Dentures (specify type) ADL Screening (condition at time of admission) Patient's cognitive ability adequate to safely complete daily activities?: Yes Is the patient deaf or have difficulty hearing?: No Does the patient have difficulty seeing, even when wearing glasses/contacts?: No Does the patient have difficulty  concentrating, remembering, or making decisions?: No Patient able to express need for assistance with ADLs?: Yes Does the patient have difficulty dressing or bathing?: No Independently performs ADLs?: Yes (appropriate for developmental age) Does the patient have difficulty walking or climbing stairs?: No Weakness of Legs: None Weakness of Arms/Hands: None  Permission Sought/Granted Permission sought to share information with : Facility Art therapist granted to share information with : Yes, Verbal Permission Granted  Share Information with NAME: Juanda Crumble     Permission granted to share info w Relationship: spouse  Permission granted to share info w Contact Information: [phone]  Emotional Assessment Appearance:: Appears stated age Attitude/Demeanor/Rapport: Gracious Affect (typically observed): Accepting Orientation: : Oriented to Self, Oriented to Place, Oriented to  Time Alcohol / Substance Use: Not Applicable Psych Involvement: No (comment)  Admission diagnosis:  Back pain [M54.9] Fall, initial encounter [W19.XXXA] Postoperative pain after spinal surgery [G89.18] Acute back pain [M54.9] Patient Active Problem List   Diagnosis Date Noted  . Acute back pain 04/13/2020  . Postoperative pain after spinal surgery   . Acute metabolic encephalopathy   . Back pain 04/12/2020  . Osteoporosis 02/04/2018  . 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

## 2020-04-15 NOTE — Progress Notes (Signed)
    Attending Progress Note  History: Janice Riley is here for AMS and difficulty at home after surgery.   She continues to improve.  Her sensorium, back pain, and leg pain are better.    Physical Exam: Vitals:   04/15/20 0548 04/15/20 0838  BP: (!) 166/84 (!) 147/76  Pulse: 81 89  Resp: 19 17  Temp: 99.4 F (37.4 C) 98.6 F (37 C)  SpO2: 98% 95%    AA Ox3 CNI  Strength:5/5 throughout BLE  Incision c/d/i  Data:  Recent Labs  Lab 04/13/20 0335 04/13/20 0335 04/14/20 0404 04/14/20 0404 04/15/20 0344  NA 135   < > 135   < > 134*  K 3.6   < > 3.3*   < > 3.7  CL 101   < > 102   < > 103  CO2 23   < > 24   < > 22  BUN 9   < > 16   < > 15  CREATININE 0.76   < > 0.80   < > 0.87  GLUCOSE 117*   < > 106*   < > 108*  CALCIUM 7.8*  --  7.6*  --  8.3*   < > = values in this interval not displayed.   Recent Labs  Lab 04/12/20 1723  AST 27  ALT 16  ALKPHOS 65     Recent Labs  Lab 04/12/20 1723 04/12/20 1723 04/13/20 0335 04/13/20 0335 04/14/20 0404  WBC 12.9*   < > 14.5*   < > 9.8  HGB 12.3   < > 11.9*   < > 11.2*  HCT 37.2   < > 36.1   < > 32.9*  PLT 248  --  258  --  229   < > = values in this interval not displayed.   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  - mobilize - pain control - DVT prophylaxis - PTOT - other care per internist - Dispo planning - likely SNF unless she improves over the next 1-2 days  Meade Maw MD, [facility] Department of Neurosurgery

## 2020-04-15 NOTE — Progress Notes (Signed)
PROGRESS NOTE   Janice Riley  KTG:[id number]    DOB: Jun 01, 1940    DOA: 04/12/2020  PCP: Adin Hector, MD   I have briefly reviewed patients previous medical [facility] in [facility].  Chief Complaint  Patient presents with  . Atrial Fibrillation  . 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  Admitted for postop acute back pain with gait ataxia and acute metabolic encephalopathy.  Neurosurgery consulted.  Mental status changes improved, back pain better, no recurrence of solitary fever.   Assessment & Plan:  Active Problems:   Back pain   Acute back pain   Postoperative pain after spinal surgery   Acute metabolic encephalopathy   Postop acute back pain, s/p left L4-5 laminforaminotomy for synovial cyst resection 11/24: Neurosurgery consultation appreciated > low suspicion for an operative complication and feels like this is a pain exacerbation.  Briefly got IV Decadron which was stopped due to AMS.  Ordered Toradol but did not need any.  Utilize multimodality pain management including scheduled Tylenol, as needed oxycodone which she has not used much of, mobilization with therapies.  Back pain continues to improve.  Patient and spouse not keen on going to SNF but advised them that we need to see how she performs with physical therapy and their recommendations prior to making further decisions and they verbalized understanding.  Still has some urinary incontinence, need to monitor.  Acute metabolic encephalopathy: Likely  multifactorial.  Acute illness including recent procedure under GA/intubation, postop pain, pain meds and hospital delirium complicating underlying cognitive impairment/possible dementia.  Delirium precautions.  Minimized sedatives/opioids as much as possible.  Family at bedside should help with reorientation.  No focal deficits.  Monitor closely.  UA not indicated above infection.  As per spouse at bedside today, mental status changes have resolved and she is back to her baseline.  Fever: Overnight 11/26 had fever of 101 F.  No clear source per history or physical exam.  Urine culture on previous admission showed E. coli but urine microscopy this admission not indicated of UTI.  Chest x-ray negative.  Postop wound without acute findings.  Leukocytosis resolved.  If develops recurrent fevers, plan blood culture and consider ceftriaxone.  No recurrence.  Hypothyroidism: Continue Synthroid.  Hypokalemia: Replaced  Elevated blood pressures Do not see a diagnosis of hypertension and not on antihypertensives PTA.  Could be related to pain.  Monitor closely and consider as needed hydralazine for BP consistently >170/100.  Better.  Acute blood loss anemia: Due to recent procedure.  Hemoglobin stable.  Follow CBC periodically.  Leukocytosis: Likely stress response.  UA negative.  Resolved.  Body mass index is 23.3 kg/m.   DVT prophylaxis: enoxaparin (LOVENOX) injection 40 mg Start: 04/12/20 2200     Code Status: Full Code Family Communication: I discussed in detail with patient's spouse at bedside 11/28, updated care and answered all questions. Disposition:  Status is: Inpatient  The patient will require care spanning > 2 midnights and should be moved to inpatient because: Inpatient level of care appropriate due to severity of illness  Dispo: The patient is  from: Home              Anticipated d/c is to: SNF              Anticipated d/c date is: 1 to 2 days.              Patient currently  is not medically stable to d/c.        Consultants:   Neurosurgery  Procedures:   None  Antimicrobials:    Anti-infectives (From admission, onward)   None        Subjective:  Seen along with spouse at bedside.  Overall continues to improve.  Back pain better.  Mental status changes resolved and back to baseline per spouse.  Had BM yesterday.  Per nursing, still had urinary incontinence.  Objective:   Vitals:   04/14/20 2343 04/15/20 0548 04/15/20 0838 04/15/20 1111  BP: 134/69 (!) 166/84 (!) 147/76 (!) 126/55  Pulse: 81 81 89 (!) 52  Resp: 17 19 17 16   Temp: 99 F (37.2 C) 99.4 F (37.4 C) 98.6 F (37 C) 98.4 F (36.9 C)  TempSrc: Oral  Oral   SpO2: 97% 98% 95% 97%  Weight:      Height:        General exam: Pleasant elderly female, moderately built and nourished sitting up comfortably in bed without distress.  Oral mucosa moist.  Overall continues to look better each day.  Appears to be in good spirits. Respiratory system: Clear to auscultation. Respiratory effort normal. Cardiovascular system: S1 & S2 heard, RRR. No JVD, murmurs, rubs, gallops or clicks. No pedal edema. Gastrointestinal system: Abdomen is nondistended, soft and nontender. No organomegaly or masses felt. Normal bowel sounds heard. Central nervous system: Alert and oriented x4 and answers questions appropriately. No focal neurological deficits. Extremities: Symmetric 5 x 5 power.  Symmetric 5 x 5 power. Skin: No rashes, lesions or ulcers.  As examined on 11/27: Mid lumbar spine surgical site without acute findings, no redness, drainage or tenderness.  Sutures intact. Psychiatry: Judgement and insight intact. Mood & affect pleasant and appropriate.    Data Reviewed:   I have personally reviewed following labs and imaging studies   CBC: Recent Labs  Lab 04/12/20 1723 04/13/20 0335 04/14/20 0404  WBC 12.9* 14.5* 9.8  HGB 12.3 11.9* 11.2*  HCT 37.2 36.1 32.9*  MCV 93.2 91.9 91.4  PLT 248  258 426    Basic Metabolic Panel: Recent Labs  Lab 04/13/20 0335 04/14/20 0404 04/15/20 0344  NA 135 135 134*  K 3.6 3.3* 3.7  CL 101 102 103  CO2 23 24 22   GLUCOSE 117* 106* 108*  BUN 9 16 15   CREATININE 0.76 0.80 0.87  CALCIUM 7.8* 7.6* 8.3*    Liver Function Tests: Recent Labs  Lab 04/12/20 1723  AST 27  ALT 16  ALKPHOS 65  BILITOT 0.7  PROT 7.3  ALBUMIN 3.9    CBG: No results for input(s): GLUCAP in the last 168 hours.  Microbiology Studies:   Recent Results (from the past 240 hour(s))  SARS CORONAVIRUS 2 (TAT 6-24 HRS) Nasopharyngeal Nasopharyngeal Swab     Status: None   Collection Time: 04/09/20  2:08 PM   Specimen: Nasopharyngeal Swab  Result Value Ref Range Status   SARS Coronavirus 2 NEGATIVE NEGATIVE Final    Comment: (NOTE) SARS-CoV-2 target nucleic acids are NOT DETECTED.  The SARS-CoV-2 RNA is generally detectable in upper and lower respiratory specimens during the acute phase  of infection. Negative results do not preclude SARS-CoV-2 infection, do not rule out co-infections with other pathogens, and should not be used as the sole basis for treatment or other patient management decisions. Negative results must be combined with clinical observations, patient history, and epidemiological information. The expected result is Negative.  Fact Sheet for Patients: SugarRoll.be  Fact Sheet for Healthcare Providers: https://www.woods-mathews.com/  This test is not yet approved or cleared by the [place] FDA and  has been authorized for detection and/or diagnosis of SARS-CoV-2 by FDA under an Emergency Use Authorization (EUA). This EUA will remain  in effect (meaning this test can be used) for the duration of the COVID-19 declaration under Se ction 564(b)(1) of the Act, 21 U.S.C. section 360bbb-3(b)(1), unless the authorization is terminated or revoked sooner.  Performed at [facility], [place] [address] [place], [place] [address]   Surgical pcr screen     Status: None   Collection Time: 04/09/20  2:08 PM  Result Value Ref Range Status   MRSA, PCR NEGATIVE NEGATIVE Final   Staphylococcus aureus NEGATIVE NEGATIVE Final    Comment: (NOTE) The Xpert SA Assay (FDA approved for NASAL specimens in patients 24 years of age and older), is one component of a comprehensive surveillance program. It is not intended to diagnose infection nor to guide or monitor treatment. Performed at [facility], [address] [place], [place] [address]   Urine Culture     Status: Abnormal   Collection Time: 04/09/20  2:08 PM   Specimen: Urine, Random  Result Value Ref Range Status   Specimen Description   Final    URINE, RANDOM Performed at [facility], [address] [place], [place] [address]    Special Requests   Final    NONE Performed at [facility], [address] [place], [place] [address]    Culture >=100,000 COLONIES/mL ESCHERICHIA COLI (A)  Final   Report Status 04/12/2020 FINAL  Final   Organism ID, Bacteria ESCHERICHIA COLI (A)  Final      Susceptibility   Escherichia coli - MIC*    AMPICILLIN <=2 SENSITIVE Sensitive     CEFAZOLIN <=4 SENSITIVE Sensitive     CEFEPIME <=0.12 SENSITIVE Sensitive     CEFTRIAXONE <=0.25 SENSITIVE Sensitive     CIPROFLOXACIN <=0.25 SENSITIVE Sensitive     GENTAMICIN <=1 SENSITIVE Sensitive     IMIPENEM <=0.25 SENSITIVE Sensitive     NITROFURANTOIN <=16 SENSITIVE Sensitive     TRIMETH/SULFA <=20 SENSITIVE Sensitive     AMPICILLIN/SULBACTAM <=2 SENSITIVE Sensitive     PIP/TAZO <=4 SENSITIVE Sensitive     * >=100,000 COLONIES/mL ESCHERICHIA COLI  Resp Panel by RT-PCR (Flu A&B, Covid) Nasopharyngeal Swab     Status: None   Collection Time: 04/12/20  6:40 PM   Specimen: Nasopharyngeal Swab; Nasopharyngeal(NP) swabs in vial transport medium  Result Value Ref Range Status   SARS Coronavirus 2 by RT PCR NEGATIVE NEGATIVE  Final    Comment: (NOTE) SARS-CoV-2 target nucleic acids are NOT DETECTED.  The SARS-CoV-2 RNA is generally detectable in upper respiratory specimens during the acute phase of infection. The lowest concentration of SARS-CoV-2 viral copies this assay can detect is 138 copies/mL. A negative result does not preclude SARS-Cov-2 infection and should not be used as the sole basis for treatment or other patient management decisions. A negative result may occur with  improper specimen collection/handling, submission of specimen other than nasopharyngeal swab, presence of viral mutation(s)  within the areas targeted by this assay, and inadequate number of viral copies(<138 copies/mL). A negative result must be combined with clinical observations, patient history, and epidemiological information. The expected result is Negative.  Fact Sheet for Patients:  EntrepreneurPulse.com.au  Fact Sheet for Healthcare Providers:  IncredibleEmployment.be  This test is no t yet approved or cleared by the [place] FDA and  has been authorized for detection and/or diagnosis of SARS-CoV-2 by FDA under an Emergency Use Authorization (EUA). This EUA will remain  in effect (meaning this test can be used) for the duration of the COVID-19 declaration under Section 564(b)(1) of the Act, 21 U.S.C.section 360bbb-3(b)(1), unless the authorization is terminated  or revoked sooner.       Influenza A by PCR NEGATIVE NEGATIVE Final   Influenza B by PCR NEGATIVE NEGATIVE Final    Comment: (NOTE) The Xpert Xpress SARS-CoV-2/FLU/RSV plus assay is intended as an aid in the diagnosis of influenza from Nasopharyngeal swab specimens and should not be used as a sole basis for treatment. Nasal washings and aspirates are unacceptable for Xpert Xpress SARS-CoV-2/FLU/RSV testing.  Fact Sheet for Patients: EntrepreneurPulse.com.au  Fact Sheet for Healthcare  Providers: IncredibleEmployment.be  This test is not yet approved or cleared by the [place] FDA and has been authorized for detection and/or diagnosis of SARS-CoV-2 by FDA under an Emergency Use Authorization (EUA). This EUA will remain in effect (meaning this test can be used) for the duration of the COVID-19 declaration under Section 564(b)(1) of the Act, 21 U.S.C. section 360bbb-3(b)(1), unless the authorization is terminated or revoked.  Performed at [facility], [address] [place], [place] [address]      Radiology Studies:  No results found.   Scheduled Meds:   . acetaminophen  1,000 mg Oral TID  . cholecalciferol  1,000 Units Oral Daily  . enoxaparin (LOVENOX) injection  40 mg Subcutaneous Q24H  . levothyroxine  100 mcg Oral Daily  . polyethylene glycol  17 g Oral Daily    Continuous Infusions:   . sodium chloride 10 mL/hr at 04/13/20 1520     LOS: 2 days     Vernell Leep, MD, [place], [facility]    To contact the attending provider between 7A-7P or the covering provider during after hours 7P-7A, please log into the web site [email or web address] and access using universal [place] password for that web site. If you do not have the password, please call the hospital operator.  04/15/2020, 12:35 PM

## 2020-04-15 NOTE — Progress Notes (Signed)
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 Pt/family then noticed severe weakness and returned to hospital.     PT Comments    Pt tolerated treatment well today and was able to improve overall assistance levels, activity tolerance, and ambulation distance since last session. Despite progress, pt continues to be limited with meeting goals due to impaired cognition, poor safety awareness, and poor balance. Due to deficits pt required supervision - CGA for bed mobility, mod-min assist for transfers with RW, and mod assist to take 6 steps from EOB>recliner. Pt able to perform x5 STS from recliner with RW, but required mod multimodal cues for safety and corrective weight shift in standing due to significant posterior lean. Pt and spouse educated regarding pt progress, DC recommendation, pt impairments, and safety concerns; they verbalized understanding. Pt will continue to benefit from skilled acute PT services to address deficits for return to baseline function. Will continue to recommend SNF at DC. Pt and spouse seem agreeable and wanted more information regarding potential SNF options in the area; RN notified.     Follow Up Recommendations  SNF     Equipment Recommendations  Rolling walker with 5" wheels    Recommendations for Other Services       Precautions / Restrictions Precautions Precautions: Fall Restrictions Weight Bearing Restrictions: No    Mobility  Bed Mobility Overal bed mobility: Needs Assistance Bed Mobility: Supine to Sit;Rolling Rolling: Supervision   Supine to sit: Min guard;HOB elevated     General bed mobility comments: Min guard to sit EOB with HOB elevated and increased reliance of UE on handrail. Posterior lean required min guard for  safety. Supervision for safety to perform bil rolling in bed for donning brief.  Transfers Overall transfer level: Needs assistance Equipment used: Rolling walker (2 wheeled) Transfers: Sit to/from Stand Sit to Stand: Min assist;From elevated surface         General transfer comment: Min assist to stand from elevated bed height x1 and recliner x5 with RW. Mod multimodal cues provided for hand placement to ensure safety with transfer. Pt with significant posterior lean upon initial standing, requiring min assist for maintenance of balance.  Ambulation/Gait Ambulation/Gait assistance: Mod assist Gait Distance (Feet): 2 Feet (6 steps from EOB>recliner) Assistive device: Rolling walker (2 wheeled)   Gait velocity: decreased   General Gait Details: Pt required mod assist to take 6 steps from EOB>recliner with RW. Pt demonstrated shuffled gait pattern, poor safety awareness, poor balance, and decreased step length/foot clearance bil. Pt required mod multimodal cues for sequencing and RW negotiation.   Stairs             Wheelchair Mobility    Modified Rankin (Stroke Patients Only)       Balance Overall balance assessment: Needs assistance;History of Falls Sitting-balance support: Bilateral upper extremity supported;Feet supported Sitting balance-Leahy Scale: Poor Sitting balance - Comments: Poor seated balance demonstrated by posterior lean that required mod assist and max cueing for correction. Pt unable to perform corrective weight shift. Postural control: Posterior lean Standing balance support: Bilateral upper extremity supported Standing balance-Leahy Scale: Poor Standing balance comment: Poor standing balance with BUE support on RW requiring min-mod assist for maintenance of balance. Pt unable to perform corrective weight shift despite cues and education.  Cognition Arousal/Alertness: Awake/alert Behavior During Therapy:  Anxious Overall Cognitive Status: Impaired/Different from baseline Area of Impairment: Memory;Following commands;Safety/judgement;Problem solving                     Memory: Decreased recall of precautions Following Commands: Follows one step commands with increased time Safety/Judgement: Decreased awareness of safety   Problem Solving: Slow processing;Difficulty sequencing;Requires verbal cues General Comments: Requires increased time/repetition for sequencing transfer and safety awareness      Exercises Other Exercises Other Exercises: Pt and spouse educated regarding DC recommendations based on functional impairments and safety concerns. Other Exercises: Pt able to perform x5 STS from recliner with RW. Mod multimodal cues provided for hand placement and sequencing to ensure safety with transfer. Poor standing balance demonstrated by increased posterior lean and intermittent L lateral lean. Pt unable to perform corrective weight shift despite max education and cues.    General Comments        Pertinent Vitals/Pain Pain Assessment: No/denies pain (reports back discomfort)    Home Living                      Prior Function            PT Goals (current goals can now be found in the care plan section) Acute Rehab PT Goals Patient Stated Goal: to get stronger PT Goal Formulation: With patient Time For Goal Achievement: 04/27/20 Potential to Achieve Goals: Good Additional Goals Additional Goal #1: Pt will be able to perform bed mobility/transfers with cga and safe technique in order to improve functional independence Progress towards PT goals: Progressing toward goals    Frequency    Min 2X/week      PT Plan Current plan remains appropriate    Co-evaluation              AM-PAC PT "6 Clicks" Mobility   Outcome Measure  Help needed turning from your back to your side while in a flat bed without using bedrails?: None Help needed moving from  lying on your back to sitting on the side of a flat bed without using bedrails?: A Little Help needed moving to and from a bed to a chair (including a wheelchair)?: A Lot Help needed standing up from a chair using your arms (e.g., wheelchair or bedside chair)?: A Lot Help needed to walk in hospital room?: A Lot Help needed climbing 3-5 steps with a railing? : Total 6 Click Score: 14    End of Session Equipment Utilized During Treatment: Gait belt Activity Tolerance: Patient tolerated treatment well Patient left: in chair;with chair alarm set;with family/visitor present Nurse Communication: Mobility status PT Visit Diagnosis: Unsteadiness on feet (R26.81);Muscle weakness (generalized) (M62.81);History of falling (Z91.81);Difficulty in walking, not elsewhere classified (R26.2)     Time: 6803-2122 PT Time Calculation (min) (ACUTE ONLY): 23 min  Charges:  $Therapeutic Activity: 23-37 mins                    Herminio Commons, PT, DPT 2:57 PM,04/15/20

## 2020-04-15 NOTE — Plan of Care (Signed)
  Problem: Health Behavior/Discharge Planning: Goal: Ability to manage health-related needs will improve Outcome: Progressing   Problem: Clinical Measurements: Goal: Ability to maintain clinical measurements within normal limits will improve Outcome: Progressing Goal: Will remain free from infection Outcome: Progressing Goal: Diagnostic test results will improve Outcome: Progressing   

## 2020-04-16 DIAGNOSIS — G8918 Other acute postprocedural pain: Secondary | ICD-10-CM | POA: Diagnosis not present

## 2020-04-16 DIAGNOSIS — G9341 Metabolic encephalopathy: Secondary | ICD-10-CM | POA: Diagnosis not present

## 2020-04-16 NOTE — TOC Progression Note (Signed)
Transition of Care [facility]) - Progression Note    Patient Details  Name: Janice Riley MRN: [id number] Date of Birth: December 04, 1940  Transition of Care [facility]) CM/SW Export, LCSW Phone Number: 04/16/2020, 11:19 AM  Clinical Narrative:   Reached out to [facility] to see if they will review bed request. [facility] (family preference per weekend [facility]) declined due to insurance. Will update family with bed offers once this information is available.    Expected Discharge Plan: [place] Barriers to Discharge: Continued Medical Work up  Expected Discharge Plan and Services Expected Discharge Plan: [place] arrangements for the past 2 months: Single Family Home                                       Social Determinants of Health (SDOH) Interventions    Readmission Risk Interventions No flowsheet data found.

## 2020-04-16 NOTE — Care Management Important Message (Signed)
Important Message  Patient Details  Name: Janice Riley MRN: [id number] Date of Birth: 1940/08/24   Medicare Important Message Given:  N/A - LOS <3 / Initial given by admissions     Juliann Pulse A Idamae Coccia 04/16/2020, 8:15 AM

## 2020-04-16 NOTE — Progress Notes (Signed)
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  She has been working with therapy.  She states that she does still have some tingling sensation in her feet bilaterally.  She has been up to the bedside commode with nursing.   Physical Exam: Vitals:   04/16/20 0015 04/16/20 0450  BP: 121/64 (!) 155/81  Pulse: 71 72  Resp: 17 18  Temp: 98.1 F (36.7 C) 98.1 F (36.7 C)  SpO2: 98%     AA Ox3 Strength:5/5 throughout BLE Sensation appears intact to light touch throughout Incision c/d/i  Data:  Recent Labs  Lab 04/13/20 0335 04/13/20 0335 04/14/20 0404 04/14/20 0404 04/15/20 0344  NA 135   < > 135   < > 134*  K 3.6   < > 3.3*   < > 3.7  CL 101   < > 102   < > 103  CO2 23   < > 24   < > 22  BUN 9   < > 16   < > 15  CREATININE 0.76   < > 0.80   < > 0.87  GLUCOSE 117*   < > 106*   < > 108*  CALCIUM 7.8*  --  7.6*  --  8.3*   < > = values in this interval not displayed.   Recent Labs  Lab 04/12/20 1723  AST 27  ALT 16  ALKPHOS 65     Recent Labs  Lab 04/12/20 1723 04/12/20 1723 04/13/20 0335 04/13/20 0335 04/14/20 0404  WBC 12.9*   < > 14.5*   < > 9.8  HGB 12.3   < > 11.9*   < > 11.2*  HCT 37.2   < > 36.1   < > 32.9*  PLT 248  --  258  --  229   < > = values in this interval not displayed.   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  - mobilize - DVT prophylaxis - PTOT working with patient - Appreciate medical care of internist  - Dispo planning - likely SNF vs home   Deetta Perla, MD Department of Neurosurgery

## 2020-04-16 NOTE — Progress Notes (Signed)
PROGRESS NOTE   Janice Riley  OXB:[id number]    DOB: 11-12-1940    DOA: 04/12/2020  PCP: Adin Hector, MD   I have briefly reviewed patients previous medical records in [facility]  Chief Complaint  Patient presents with  . Atrial Fibrillation  . 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  Admitted for postop acute back pain with gait ataxia and acute metabolic encephalopathy.  Neurosurgery consulted.  Mental status changes improved, back pain better, no recurrence of solitary fever.  Medically stable for DC pending SNF bed-TOC team working on it.   Assessment & Plan:  Active Problems:   Back pain   Acute back pain   Postoperative pain after spinal surgery   Acute metabolic encephalopathy   Postop acute back pain, s/p left L4-5 laminforaminotomy for synovial cyst resection 11/24: Neurosurgery consultation appreciated > low suspicion for an operative complication and feels like this is a pain exacerbation.  Briefly got IV Decadron which was stopped due to AMS.  Ordered Toradol but did not need any.  Utilize multimodality pain management including scheduled Tylenol, as needed oxycodone which she has not been using much of, mobilization with therapies.  Back pain continues to improve.  Patient and spouse not keen on going to SNF but advised them that we need to see how she performs with physical therapy and their recommendations prior to making further decisions and they verbalized understanding. Improved and stable for  discharge to SNF.  Acute metabolic encephalopathy: Likely multifactorial.  Acute illness including recent procedure under GA/intubation, postop pain, pain meds and hospital delirium complicating underlying cognitive impairment/possible dementia.  Delirium precautions.  Minimized sedatives/opioids as much as possible.  Family at bedside should help with reorientation.  No focal deficits.  Monitor closely.  UA not indicated above infection.  Resolved.  Fever: Overnight 11/26 had fever of 101 F.  No clear source per history or physical exam.  Urine culture on previous admission showed E. coli but urine microscopy this admission not indicated of UTI.  Chest x-ray negative.  Postop wound without acute findings.  Leukocytosis resolved.  If develops recurrent fevers, plan blood culture and consider ceftriaxone.  No recurrence.  Hypothyroidism: Continue Synthroid.  Hypokalemia: Replaced  Elevated blood pressures Do not see a diagnosis of hypertension and not on antihypertensives PTA.  Could be related to pain.  Monitor closely and consider as needed hydralazine for BP consistently >170/100.  Better.  Acute blood loss anemia: Due to recent procedure.  Hemoglobin stable.  Follow CBC periodically.  Leukocytosis: Likely stress response.  UA negative.  Resolved.  Body mass index is 23.3 kg/m.   DVT prophylaxis: enoxaparin (LOVENOX) injection 40 mg Start: 04/12/20 2200     Code Status: Full Code Family Communication: I discussed in detail with patient's spouse at bedside 11/29, updated care and answered all questions. Disposition:  Status is: Inpatient  The patient will require care spanning > 2 midnights and should be moved to inpatient because: Inpatient level of care appropriate due to severity of illness  Dispo: The patient is from: Home  Anticipated d/c is to: SNF              Anticipated d/c date is: Medically stable for DC pending SNF bed.  Patient/spouse were interested in  [facility] which have declined.  TOC team exploring other SNF options.              Patient currently medically stable for DC.        Consultants:   Neurosurgery  Procedures:   None  Antimicrobials:    Anti-infectives (From admission, onward)   None        Subjective:  Seen this morning.  Had some back pain.  As per chart review, did better with PT yesterday.  Patient states that she is able to "partially control" her urination.  As per RN, no acute issues reported.  Objective:   Vitals:   04/16/20 0015 04/16/20 0450 04/16/20 0734 04/16/20 1123  BP: 121/64 (!) 155/81 (!) 151/79 135/68  Pulse: 71 72 78 67  Resp: 17 18 16 15   Temp: 98.1 F (36.7 C) 98.1 F (36.7 C) 97.7 F (36.5 C) 98.2 F (36.8 C)  TempSrc:  Oral    SpO2: 98%  98% 99%  Weight:      Height:        General exam: Pleasant elderly female, moderately built and nourished lying comfortably propped up in bed without distress. Respiratory system: Clear to auscultation. Respiratory effort normal. Cardiovascular system: S1 & S2 heard, RRR. No JVD, murmurs, rubs, gallops or clicks. No pedal edema. Gastrointestinal system: Abdomen is nondistended, soft and nontender. No organomegaly or masses felt. Normal bowel sounds heard. Central nervous system: Alert and oriented. No focal neurological deficits. Extremities: Symmetric 5 x 5 power.  Symmetric 5 x 5 power. Skin: No rashes, lesions or ulcers.  As examined on 11/27: Mid lumbar spine surgical site without acute findings, no redness, drainage or tenderness.  Sutures intact. Psychiatry: Judgement and insight intact. Mood & affect pleasant and appropriate.    Data Reviewed:   I have personally reviewed following labs and imaging studies   CBC: Recent Labs  Lab 04/12/20 1723 04/13/20 0335 04/14/20 0404  WBC 12.9* 14.5* 9.8  HGB 12.3 11.9* 11.2*  HCT 37.2 36.1 32.9*  MCV 93.2 91.9 91.4  PLT 248 258 433    Basic Metabolic Panel: Recent Labs  Lab  04/13/20 0335 04/14/20 0404 04/15/20 0344  NA 135 135 134*  K 3.6 3.3* 3.7  CL 101 102 103  CO2 23 24 22   GLUCOSE 117* 106* 108*  BUN 9 16 15   CREATININE 0.76 0.80 0.87  CALCIUM 7.8* 7.6* 8.3*    Liver Function Tests: Recent Labs  Lab 04/12/20 1723  AST 27  ALT 16  ALKPHOS 65  BILITOT 0.7  PROT 7.3  ALBUMIN 3.9    CBG: No results for input(s): GLUCAP in the last 168 hours.  Microbiology Studies:   Recent Results (from the past 240 hour(s))  SARS CORONAVIRUS 2 (TAT 6-24 HRS) Nasopharyngeal Nasopharyngeal Swab     Status: None   Collection Time: 04/09/20  2:08 PM   Specimen: Nasopharyngeal Swab  Result Value Ref Range Status   SARS Coronavirus 2 NEGATIVE NEGATIVE Final    Comment: (NOTE) SARS-CoV-2 target nucleic acids are NOT DETECTED.  The SARS-CoV-2 RNA is generally detectable in upper and lower respiratory specimens during the acute phase of infection. Negative results do not preclude SARS-CoV-2 infection, do not rule out co-infections with other pathogens, and should not be  used as the sole basis for treatment or other patient management decisions. Negative results must be combined with clinical observations, patient history, and epidemiological information. The expected result is Negative.  Fact Sheet for Patients: SugarRoll.be  Fact Sheet for Healthcare Providers: https://www.woods-mathews.com/  This test is not yet approved or cleared by the [place] FDA and  has been authorized for detection and/or diagnosis of SARS-CoV-2 by FDA under an Emergency Use Authorization (EUA). This EUA will remain  in effect (meaning this test can be used) for the duration of the COVID-19 declaration under Se ction 564(b)(1) of the Act, 21 U.S.C. section 360bbb-3(b)(1), unless the authorization is terminated or revoked sooner.  Performed at [facility], [place] [address] [place], [place] [address]   Surgical pcr  screen     Status: None   Collection Time: 04/09/20  2:08 PM  Result Value Ref Range Status   MRSA, PCR NEGATIVE NEGATIVE Final   Staphylococcus aureus NEGATIVE NEGATIVE Final    Comment: (NOTE) The Xpert SA Assay (FDA approved for NASAL specimens in patients 17 years of age and older), is one component of a comprehensive surveillance program. It is not intended to diagnose infection nor to guide or monitor treatment. Performed at [facility], [address] [place], [place] [address]   Urine Culture     Status: Abnormal   Collection Time: 04/09/20  2:08 PM   Specimen: Urine, Random  Result Value Ref Range Status   Specimen Description   Final    URINE, RANDOM Performed at [facility], [address] [place], [place] [address]    Special Requests   Final    NONE Performed at [facility], [address] [place], [place] [address]    Culture >=100,000 COLONIES/mL ESCHERICHIA COLI (A)  Final   Report Status 04/12/2020 FINAL  Final   Organism ID, Bacteria ESCHERICHIA COLI (A)  Final      Susceptibility   Escherichia coli - MIC*    AMPICILLIN <=2 SENSITIVE Sensitive     CEFAZOLIN <=4 SENSITIVE Sensitive     CEFEPIME <=0.12 SENSITIVE Sensitive     CEFTRIAXONE <=0.25 SENSITIVE Sensitive     CIPROFLOXACIN <=0.25 SENSITIVE Sensitive     GENTAMICIN <=1 SENSITIVE Sensitive     IMIPENEM <=0.25 SENSITIVE Sensitive     NITROFURANTOIN <=16 SENSITIVE Sensitive     TRIMETH/SULFA <=20 SENSITIVE Sensitive     AMPICILLIN/SULBACTAM <=2 SENSITIVE Sensitive     PIP/TAZO <=4 SENSITIVE Sensitive     * >=100,000 COLONIES/mL ESCHERICHIA COLI  Resp Panel by RT-PCR (Flu A&B, Covid) Nasopharyngeal Swab     Status: None   Collection Time: 04/12/20  6:40 PM   Specimen: Nasopharyngeal Swab; Nasopharyngeal(NP) swabs in vial transport medium  Result Value Ref Range Status   SARS Coronavirus 2 by RT PCR NEGATIVE NEGATIVE Final    Comment: (NOTE) SARS-CoV-2 target  nucleic acids are NOT DETECTED.  The SARS-CoV-2 RNA is generally detectable in upper respiratory specimens during the acute phase of infection. The lowest concentration of SARS-CoV-2 viral copies this assay can detect is 138 copies/mL. A negative result does not preclude SARS-Cov-2 infection and should not be used as the sole basis for treatment or other patient management decisions. A negative result may occur with  improper specimen collection/handling, submission of specimen other than nasopharyngeal swab, presence of viral mutation(s) within the areas targeted by this assay, and inadequate number of viral copies(<138 copies/mL). A negative result must be combined with  clinical observations, patient history, and epidemiological information. The expected result is Negative.  Fact Sheet for Patients:  EntrepreneurPulse.com.au  Fact Sheet for Healthcare Providers:  IncredibleEmployment.be  This test is no t yet approved or cleared by the [place] FDA and  has been authorized for detection and/or diagnosis of SARS-CoV-2 by FDA under an Emergency Use Authorization (EUA). This EUA will remain  in effect (meaning this test can be used) for the duration of the COVID-19 declaration under Section 564(b)(1) of the Act, 21 U.S.C.section 360bbb-3(b)(1), unless the authorization is terminated  or revoked sooner.       Influenza A by PCR NEGATIVE NEGATIVE Final   Influenza B by PCR NEGATIVE NEGATIVE Final    Comment: (NOTE) The Xpert Xpress SARS-CoV-2/FLU/RSV plus assay is intended as an aid in the diagnosis of influenza from Nasopharyngeal swab specimens and should not be used as a sole basis for treatment. Nasal washings and aspirates are unacceptable for Xpert Xpress SARS-CoV-2/FLU/RSV testing.  Fact Sheet for Patients: EntrepreneurPulse.com.au  Fact Sheet for Healthcare  Providers: IncredibleEmployment.be  This test is not yet approved or cleared by the [place] FDA and has been authorized for detection and/or diagnosis of SARS-CoV-2 by FDA under an Emergency Use Authorization (EUA). This EUA will remain in effect (meaning this test can be used) for the duration of the COVID-19 declaration under Section 564(b)(1) of the Act, 21 U.S.C. section 360bbb-3(b)(1), unless the authorization is terminated or revoked.  Performed at [facility], [address], [place], [place] [address]      Radiology Studies:  No results found.   Scheduled Meds:   . acetaminophen  1,000 mg Oral TID  . cholecalciferol  1,000 Units Oral Daily  . enoxaparin (LOVENOX) injection  40 mg Subcutaneous Q24H  . levothyroxine  100 mcg Oral Daily  . polyethylene glycol  17 g Oral Daily    Continuous Infusions:   . sodium chloride 10 mL/hr at 04/13/20 1520     LOS: 3 days     Vernell Leep, MD, [place], [facility]. [facility]    To contact the attending provider between 7A-7P or the covering provider during after hours 7P-7A, please log into the web site [email or web address] and access using universal [place] password for that web site. If you do not have the password, please call the hospital operator.  04/16/2020, 2:57 PM

## 2020-04-16 NOTE — Plan of Care (Signed)

## 2020-04-16 NOTE — TOC Progression Note (Addendum)
Transition of Care [facility]) - Progression Note    Patient Details  Name: Janice Riley MRN: [id number] Date of Birth: June 19, 1940  Transition of Care [facility]) CM/SW [place], LCSW Phone Number: 04/16/2020, 3:02 PM  Clinical Narrative:   Bed offers at [facility] and [facility]. Spoke to patient who defers to her husband to choose. Called husband's phone x 2. Left 2 messages requesting a return call. Started insurance authorization via phone since SNF unknown. Reference #: [id number]. Clinicals were faxed.  3:28- Spoke to patient's husband. He reported since [facility] is not an option, his next choice would be [facility]. He is ok with [facility] in [place] if [facility] cannot take patient. CSW reached out to [facility] again and asked Magda Paganini to reivew patient. Patient's husband understands that they must choose a SNF once we get insurance approval. He asked to be updated tomorrow via cell phone, [phone]. CSW will continue to follow.   Expected Discharge Plan: [place] Barriers to Discharge: Continued Medical Work up  Expected Discharge Plan and Services Expected Discharge Plan: [place] arrangements for the past 2 months: Single Family Home                                       Social Determinants of Health (SDOH) Interventions    Readmission Risk Interventions No flowsheet data found.

## 2020-04-17 DIAGNOSIS — G8918 Other acute postprocedural pain: Secondary | ICD-10-CM | POA: Diagnosis not present

## 2020-04-17 DIAGNOSIS — M545 Low back pain, unspecified: Secondary | ICD-10-CM | POA: Diagnosis not present

## 2020-04-17 DIAGNOSIS — G9341 Metabolic encephalopathy: Secondary | ICD-10-CM | POA: Diagnosis not present

## 2020-04-17 LAB — RESP PANEL BY RT-PCR (FLU A&B, COVID) ARPGX2
Influenza A by PCR: NEGATIVE
Influenza B by PCR: NEGATIVE
SARS Coronavirus 2 by RT PCR: NEGATIVE

## 2020-04-17 MED ORDER — OXYCODONE HCL 5 MG PO TABS
5.0000 mg | ORAL_TABLET | Freq: Four times a day (QID) | ORAL | 0 refills | Status: AC | PRN
Start: 2020-04-17 — End: ?

## 2020-04-17 MED ORDER — BISACODYL 10 MG RE SUPP: 10.0000 mg | Freq: Every day | RECTAL | Status: AC | PRN

## 2020-04-17 MED ORDER — POLYETHYLENE GLYCOL 3350 17 G PO PACK: 17.0000 g | PACK | Freq: Every day | ORAL | Status: AC

## 2020-04-17 MED ORDER — ACETAMINOPHEN 500 MG PO TABS: 1000.0000 mg | ORAL_TABLET | Freq: Three times a day (TID) | ORAL | Status: AC | PRN

## 2020-04-17 NOTE — Discharge Summary (Signed)
Physician Discharge Summary  Janice Riley KGY:[id number] DOB: 08/02/1940  PCP: Adin Hector, MD  Admitted from: Home Discharged to: SNF  Admit date: 04/12/2020 Discharge date: 04/17/2020  Recommendations for Outpatient Follow-up:    Contact information for follow-up providers    MD at SNF. Schedule an appointment as soon as possible for a visit.   Why: To be seen in 3 to 4 days with repeat labs (CBC & BMP)       Meade Maw, MD. Schedule an appointment as soon as possible for a visit.   Specialty: Neurosurgery Contact information: [place] [address] [phone]        Adin Hector, MD. Schedule an appointment as soon as possible for a visit.   Specialty: Internal Medicine Why: Upon discharge from SNF. Contact information: [place] [address] [phone]            Contact information for after-discharge care    Destination    [facility] SNF Preferred SNF .   Service: Skilled Nursing Contact information: [address] [place]: None    Equipment/Devices: TBD at [facility]    Discharge Condition: Improved and stable   Code Status: Full Code Diet recommendation:  Discharge Diet Orders (From admission, onward)    Start     Ordered   04/17/20 0000  Diet - low sodium heart healthy        04/17/20 1258           Discharge Diagnoses:  Active Problems:   Back pain   Acute back pain   Postoperative pain after spinal surgery   Acute metabolic encephalopathy   Brief Summary: 79 year old married female, past medical history of right breast cancer, s/p lumpectomy, partial mastectomy, radiation therapy, breast reconstruction with mastoplasty, hypothyroidism, s/p left L4-5 laminforaminotomy for resection of extradural lesion/synovial cyst under GA, discharged home same day 11/24, briefly did  well at home but then returned to the ED on 11/25 due to abrupt decline in her condition with complaints of gait abnormality, inability to walk, urinary incontinence, confusion/altered mental status and low mid back pain.    She was admitted for postop acute back pain with gait ataxia and acute metabolic encephalopathy.  Neurosurgery consulted.  Mental status changes have resolved, back pain better, no recurrence of solitary fever.    Assessment & Plan:   Postop acute low back pain, s/p left L4-5 laminforaminotomy for synovial cyst resection 11/24: Neurosurgery consultation appreciated > low suspicion for an operative complication and feels like this is a pain exacerbation.  Briefly got IV Decadron which was stopped due to AMS.  Utilized multimodality pain management including scheduled Tylenol, as needed oxycodone, mobilization with therapies and aggressive bowel regimen.    Patient reports that her back pain has substantially improved compared to admission.  Therapies evaluated and recommend SNF for short-term rehab.  Patient, family agreeable.  Eventual plans will be to return home.  Close outpatient follow-up with Dr. Cari Caraway, SNF to coordinate.  Dr. Cari Caraway reportedly discussed extensively with patient's son on the phone today and the son indicated that he has a clear picture as to the reason for her decline.  Acute metabolic encephalopathy: Likely multifactorial.  Acute illness including recent procedure under GA/intubation, postop pain, pain meds and hospital delirium complicating underlying cognitive  impairment/possible dementia.  Delirium precautions.  Minimized sedatives/opioids as much as possible.  Family at bedside should help with reorientation.  No focal deficits.  Monitor closely.  UA not indicated above infection.  Resolved.  Fever: Overnight 11/26 had fever of 101 F.  No clear source per history or physical exam.  Urine culture on previous admission showed E. coli but urine  microscopy this admission not indicated of UTI.  Chest x-ray negative.  Postop wound without acute findings.  Leukocytosis resolved.  If develops recurrent fevers, plan blood culture and consider ceftriaxone.  No recurrence.  Hypothyroidism: Continue Synthroid.  TSH: 0.905.  Hypokalemia: Replaced  Elevated blood pressures Do not see a diagnosis of hypertension and not on antihypertensives PTA.  Could be related to pain.  Monitor closely and consider as needed hydralazine for BP consistently >170/100.  Better.   Acute blood loss anemia: Due to recent procedure.  Hemoglobin stable.  Follow CBC periodically.  Leukocytosis: Likely stress response.  UA negative.  Resolved.  Body mass index is 23.3 kg/m.     Consultants:   Neurosurgery  Procedures:   None   Discharge Instructions  Discharge Instructions    Call MD for:   Complete by: As directed    Recurrent confusion or altered mental status.   Call MD for:  difficulty breathing, headache or visual disturbances   Complete by: As directed    Call MD for:  extreme fatigue   Complete by: As directed    Call MD for:  persistant dizziness or light-headedness   Complete by: As directed    Call MD for:  persistant nausea and vomiting   Complete by: As directed    Call MD for:  redness, tenderness, or signs of infection (pain, swelling, redness, odor or green/yellow discharge around incision site)   Complete by: As directed    Call MD for:  severe uncontrolled pain   Complete by: As directed    Call MD for:  temperature >100.4   Complete by: As directed    Diet - low sodium heart healthy   Complete by: As directed    Increase activity slowly   Complete by: As directed    No wound care   Complete by: As directed        Medication List    STOP taking these medications   mupirocin ointment 2 % Commonly known as: BACTROBAN     TAKE these medications   acetaminophen 500 MG tablet Commonly known as:  TYLENOL Take 2 tablets (1,000 mg total) by mouth every 8 (eight) hours as needed for mild pain, moderate pain or fever.   bisacodyl 10 MG suppository Commonly known as: DULCOLAX Place 1 suppository (10 mg total) rectally daily as needed for mild constipation or moderate constipation.   CALCIUM 1200+D3 PO Take 1 tablet by mouth daily. Notes to patient: Not given in hospital   cholecalciferol 25 MCG (1000 UNIT) tablet Commonly known as: VITAMIN D3 Take 1,000 Units by mouth daily.   levothyroxine 100 MCG tablet Commonly known as: SYNTHROID Take 100 mcg by mouth daily.   methocarbamol 500 MG tablet Commonly known as: Robaxin Take 1 tablet (500 mg total) by mouth every 6 (six) hours as needed for muscle spasms.   oxyCODONE 5 MG immediate release tablet Commonly known as: Roxicodone Take 1 tablet (5 mg total) by mouth every 6 (six) hours as needed for severe pain. What changed:   how much to take  reasons to take this  polyethylene glycol 17 g packet Commonly known as: MIRALAX / GLYCOLAX Take 17 g by mouth daily. Start taking on: April 18, 2020      Allergies  Allergen Reactions  . Tamoxifen Other (See Comments)    Cause personality changes.      Procedures/Studies:  CT Head Wo Contrast  Result Date: 04/12/2020 CLINICAL DATA:  [place].  Back surgery yesterday. EXAM: CT HEAD WITHOUT CONTRAST TECHNIQUE: Contiguous axial images were obtained from the base of the skull through the vertex without intravenous contrast. COMPARISON:  None. FINDINGS: Brain: Age related atrophy. Chronic small-vessel changes of the cerebral hemispheric white matter. No sign of acute infarction, mass lesion, hemorrhage, hydrocephalus or extra-axial collection. Ventricular size is in proportion to the degree of atrophy. Vascular: There is atherosclerotic calcification of the major vessels at the base of the brain. Skull: Negative Sinuses/Orbits: Clear/normal Other: None IMPRESSION: No acute or  traumatic finding. Age related atrophy. Chronic small-vessel changes of the cerebral hemispheric white matter. Electronically Signed   By: Nelson Chimes M.D.   On: 04/12/2020 18:29   DG Chest Port 1 View  Result Date: 04/12/2020 CLINICAL DATA:  Weakness EXAM: PORTABLE CHEST 1 VIEW COMPARISON:  None FINDINGS: No focal opacity or pleural effusion. Normal cardiomediastinal silhouette with aortic atherosclerosis. No pneumothorax. Streaky atelectasis left lung base. IMPRESSION: No active disease. Streaky atelectasis left base. Electronically Signed   By: Donavan Foil M.D.   On: 04/12/2020 19:04    Subjective: Patient interviewed and examined along with her son at bedside.  Patient's female RN was briefly present during the discussion.  Patient stated that her low back pain is gradually improving, "I am clearer in the head", urinary incontinence is also improving.  Per RN, patient had BM on 11/29.  No other complaints reported.  Advised patient's son at bedside that current hospitalization was likely related to postop low back pain, altered mental status which was multifactorial as noted above, all of this contributed to her physical deconditioning and weakness.  He indicated that the neurosurgeon had advised him that there was no "nerve damage" and I reiterated that based on my evaluation, patient had normal power in both lower extremities and hopefully should continue to improve with help of aggressive physical therapy.  If she does not progress appropriately, she can be further evaluated by neurosurgery for further evaluation and management.  He verbalized understanding.  Discharge Exam:  Vitals:   04/16/20 2347 04/17/20 0329 04/17/20 0744 04/17/20 1105  BP: 135/74 (!) 148/80 (!) 145/78 122/78  Pulse: 70 69 75 70  Resp: 14 17 16 18   Temp: 98.7 F (37.1 C) 98.5 F (36.9 C) 97.7 F (36.5 C) 98.8 F (37.1 C)  TempSrc:   Oral Oral  SpO2: 94% 99% 97% 98%  Weight:      Height:       General  exam: Pleasant elderly female, moderately built and nourished sitting up comfortably in reclining chair this morning. Respiratory system: Clear to auscultation. Respiratory effort normal. Cardiovascular system: S1 & S2 heard, RRR. No JVD, murmurs, rubs, gallops or clicks. No pedal edema. Gastrointestinal system: Abdomen is nondistended, soft and nontender. No organomegaly or masses felt. Normal bowel sounds heard. Central nervous system: Alert and oriented. No focal neurological deficits. Extremities: Symmetric 5 x 5 power in all extremities. Skin: No rashes, lesions or ulcers.  As examined on 11/27: Mid lumbar spine surgical site without acute findings, no redness, drainage or tenderness.  Sutures intact. Psychiatry: Judgement and insight intact. Mood &  affect pleasant and appropriate.    The results of significant diagnostics from this hospitalization (including imaging, microbiology, ancillary and laboratory) are listed below for reference.     Microbiology: Recent Results (from the past 240 hour(s))  SARS CORONAVIRUS 2 (TAT 6-24 HRS) Nasopharyngeal Nasopharyngeal Swab     Status: None   Collection Time: 04/09/20  2:08 PM   Specimen: Nasopharyngeal Swab  Result Value Ref Range Status   SARS Coronavirus 2 NEGATIVE NEGATIVE Final    Comment: (NOTE) SARS-CoV-2 target nucleic acids are NOT DETECTED.  The SARS-CoV-2 RNA is generally detectable in upper and lower respiratory specimens during the acute phase of infection. Negative results do not preclude SARS-CoV-2 infection, do not rule out co-infections with other pathogens, and should not be used as the sole basis for treatment or other patient management decisions. Negative results must be combined with clinical observations, patient history, and epidemiological information. The expected result is Negative.  Fact Sheet for Patients: SugarRoll.be  Fact Sheet for Healthcare  Providers: https://www.woods-mathews.com/  This test is not yet approved or cleared by the [place] FDA and  has been authorized for detection and/or diagnosis of SARS-CoV-2 by FDA under an Emergency Use Authorization (EUA). This EUA will remain  in effect (meaning this test can be used) for the duration of the COVID-19 declaration under Se ction 564(b)(1) of the Act, 21 U.S.C. section 360bbb-3(b)(1), unless the authorization is terminated or revoked sooner.  Performed at [facility], [place] [address], [place], [place] [address]   Surgical pcr screen     Status: None   Collection Time: 04/09/20  2:08 PM  Result Value Ref Range Status   MRSA, PCR NEGATIVE NEGATIVE Final   Staphylococcus aureus NEGATIVE NEGATIVE Final    Comment: (NOTE) The Xpert SA Assay (FDA approved for NASAL specimens in patients 7 years of age and older), is one component of a comprehensive surveillance program. It is not intended to diagnose infection nor to guide or monitor treatment. Performed at [facility], [address] [place], [place] [address]   Urine Culture     Status: Abnormal   Collection Time: 04/09/20  2:08 PM   Specimen: Urine, Random  Result Value Ref Range Status   Specimen Description   Final    URINE, RANDOM Performed at [facility], [address] [place], [place] [address]    Special Requests   Final    NONE Performed at [facility], [address] [place], [place] [address]    Culture >=100,000 COLONIES/mL ESCHERICHIA COLI (A)  Final   Report Status 04/12/2020 FINAL  Final   Organism ID, Bacteria ESCHERICHIA COLI (A)  Final      Susceptibility   Escherichia coli - MIC*    AMPICILLIN <=2 SENSITIVE Sensitive     CEFAZOLIN <=4 SENSITIVE Sensitive     CEFEPIME <=0.12 SENSITIVE Sensitive     CEFTRIAXONE <=0.25 SENSITIVE Sensitive     CIPROFLOXACIN <=0.25 SENSITIVE Sensitive     GENTAMICIN <=1 SENSITIVE Sensitive      IMIPENEM <=0.25 SENSITIVE Sensitive     NITROFURANTOIN <=16 SENSITIVE Sensitive     TRIMETH/SULFA <=20 SENSITIVE Sensitive     AMPICILLIN/SULBACTAM <=2 SENSITIVE Sensitive     PIP/TAZO <=4 SENSITIVE Sensitive     * >=100,000 COLONIES/mL ESCHERICHIA COLI  Resp Panel by RT-PCR (Flu A&B, Covid) Nasopharyngeal Swab     Status: None   Collection Time: 04/12/20  6:40 PM   Specimen: Nasopharyngeal Swab; Nasopharyngeal(NP)  swabs in vial transport medium  Result Value Ref Range Status   SARS Coronavirus 2 by RT PCR NEGATIVE NEGATIVE Final    Comment: (NOTE) SARS-CoV-2 target nucleic acids are NOT DETECTED.  The SARS-CoV-2 RNA is generally detectable in upper respiratory specimens during the acute phase of infection. The lowest concentration of SARS-CoV-2 viral copies this assay can detect is 138 copies/mL. A negative result does not preclude SARS-Cov-2 infection and should not be used as the sole basis for treatment or other patient management decisions. A negative result may occur with  improper specimen collection/handling, submission of specimen other than nasopharyngeal swab, presence of viral mutation(s) within the areas targeted by this assay, and inadequate number of viral copies(<138 copies/mL). A negative result must be combined with clinical observations, patient history, and epidemiological information. The expected result is Negative.  Fact Sheet for Patients:  EntrepreneurPulse.com.au  Fact Sheet for Healthcare Providers:  IncredibleEmployment.be  This test is no t yet approved or cleared by the [place] FDA and  has been authorized for detection and/or diagnosis of SARS-CoV-2 by FDA under an Emergency Use Authorization (EUA). This EUA will remain  in effect (meaning this test can be used) for the duration of the COVID-19 declaration under Section 564(b)(1) of the Act, 21 U.S.C.section 360bbb-3(b)(1), unless the authorization is  terminated  or revoked sooner.       Influenza A by PCR NEGATIVE NEGATIVE Final   Influenza B by PCR NEGATIVE NEGATIVE Final    Comment: (NOTE) The Xpert Xpress SARS-CoV-2/FLU/RSV plus assay is intended as an aid in the diagnosis of influenza from Nasopharyngeal swab specimens and should not be used as a sole basis for treatment. Nasal washings and aspirates are unacceptable for Xpert Xpress SARS-CoV-2/FLU/RSV testing.  Fact Sheet for Patients: EntrepreneurPulse.com.au  Fact Sheet for Healthcare Providers: IncredibleEmployment.be  This test is not yet approved or cleared by the [place] FDA and has been authorized for detection and/or diagnosis of SARS-CoV-2 by FDA under an Emergency Use Authorization (EUA). This EUA will remain in effect (meaning this test can be used) for the duration of the COVID-19 declaration under Section 564(b)(1) of the Act, 21 U.S.C. section 360bbb-3(b)(1), unless the authorization is terminated or revoked.  Performed at [facility], [address] [place], [place] [address]      Labs: CBC: Recent Labs  Lab 04/12/20 1723 04/13/20 0335 04/14/20 0404  WBC 12.9* 14.5* 9.8  HGB 12.3 11.9* 11.2*  HCT 37.2 36.1 32.9*  MCV 93.2 91.9 91.4  PLT 248 258 353    Basic Metabolic Panel: Recent Labs  Lab 04/12/20 1723 04/13/20 0335 04/14/20 0404 04/15/20 0344  NA 136 135 135 134*  K 3.4* 3.6 3.3* 3.7  CL 102 101 102 103  CO2 23 23 24 22   GLUCOSE 129* 117* 106* 108*  BUN 14 9 16 15   CREATININE 0.98 0.76 0.80 0.87  CALCIUM 8.3* 7.8* 7.6* 8.3*    Liver Function Tests: Recent Labs  Lab 04/12/20 1723  AST 27  ALT 16  ALKPHOS 65  BILITOT 0.7  PROT 7.3  ALBUMIN 3.9    Urinalysis    Component Value Date/Time   COLORURINE YELLOW (A) 04/12/2020 [place] (A) 04/12/2020 0753   LABSPEC 1.018 04/12/2020 [place] 6.0 04/12/2020 [place] 04/12/2020  [place] 04/12/2020 [place] 04/12/2020 0753   KETONESUR 20 (A) 04/12/2020 [place] NEGATIVE 04/12/2020 0753  NITRITE NEGATIVE 04/12/2020 [place] 04/12/2020 0753    I discussed in great detail with patient's son at bedside this morning.  Updated care and answered questions.  He was appreciative.  Time coordinating discharge: 30 minutes  SIGNED:  Vernell Leep, MD, [place], [facility]. [facility]  To contact the attending provider between 7A-7P or the covering provider during after hours 7P-7A, please log into the web site [email or web address] and access using universal [place] password for that web site. If you do not have the password, please call the hospital operator.

## 2020-04-17 NOTE — TOC Transition Note (Addendum)
Transition of Care [facility]) - CM/SW Discharge Note   Patient Details  Name: Janice Riley MRN: [id number] Date of Birth: 16-May-1941  Transition [facility]) CM/SW Contact:  Magnus Ivan, LCSW Phone Number: 04/17/2020, 12:28 PM   Clinical Narrative:  Insurance authorization obtained today. Auth # [id number]. Patient to discharge to [facility] today, Room 606A. Confirmed with Gerald Stabs at [facility]. CSW updated MD, RN, patient's husband, patient's son (who is at bedside and told patient). Informed son that this is a semi private room, he reported they would want a private room if one became available while patient is at rehab, CSW informed [facility] Representative Gerald Stabs and encouraged the family to follow up with [facility] as well while patient is there. Asked RN to call report and MD to submit DC Summary. Rapid COVID ordered. Medical Necessity Form and Face Sheet placed in Discharge Packet by patient chart. EMS transport arranged for 3:00 pick up time with [facility]. No other needs identified prior to discharge.  Final next level of care: Skilled Nursing Facility Barriers to Discharge: Barriers Resolved   Patient Goals and CMS Choice Patient states their goals for this hospitalization and ongoing recovery are:: SNF rehab CMS Medicare.gov Compare Post Acute Care list provided to:: Patient Choice offered to / list presented to : Patient, Spouse  Discharge Placement              Patient chooses bed at: [facility] [place] Patient to be transferred to facility by: [facility] Name of family member notified: Juanda Crumble and [place] Patient and family notified of of transfer: 04/17/20  Discharge Plan and Services                                     Social Determinants of Health (SDOH) Interventions     Readmission Risk Interventions No flowsheet data found.

## 2020-04-17 NOTE — Progress Notes (Signed)
Patient is stable and ready for discharge. Patient is discharging to [facility]. Patient's IV removed by nursing student with instructor. Patient's son at bedside when EMS [facility] arrived for pick up. Writer called report and spoke with Janett Billow, RN and she verbalized understanding and no questions with report provided. Patient's belongings packed by son and patient's watch and bracelet were taken home by patient's spouse. Hard script prescription in discharge packet.

## 2020-04-17 NOTE — Discharge Instructions (Signed)

## 2020-04-17 NOTE — Progress Notes (Signed)
Attending Progress Note  History: Alix Lahmann is here for AMS and difficulty at home after surgery,  Left L4/5 synovial cyst resection on 11/24  Subjective: Ms. Stahl appears stable this morning. She still feels subjectively weak and has some tingling in feet. She does report some episodes of incontinence. She is having some recurrent pain in left thigh.     Physical Exam: Vitals:   04/17/20 0329 04/17/20 0744  BP: (!) 148/80 (!) 145/78  Pulse: 69 75  Resp: 17 16  Temp: 98.5 F (36.9 C) 97.7 F (36.5 C)  SpO2: 99% 97%    AA Ox3 Strength:5/5 throughout BLE Sensation appears intact to light touch throughout except decrease over feet bilaterally   Assessment/Plan:  Tajia Szeliga is improving POD6 from L L4-5 synovial cyst resection.  - mobilize - DVT prophylaxis - PTOT working with patient - Appreciate medical care of internist  - Dispo planning - likely SNF vs home   Dr Izora Ribas to discuss patient's course with son this morning  Deetta Perla, MD Department of Neurosurgery

## 2020-04-17 NOTE — TOC Progression Note (Signed)
Transition of Care [facility]) - Progression Note    Patient Details  Name: Janice Riley MRN: [id number] Date of Birth: August 25, 1940  Transition of Care [facility]) CM/SW [place], LCSW Phone Number: 04/17/2020, 9:32 AM  Clinical Narrative:   [place] initially declined patient, this CSW spoke to Zena in Admissions who reported she did this in error and can accept patient, would have a bed available tomorrow. Patient's husband reported he is fine with [facility] or [facility]. If insurance authorization is obtained today, he reported he is fine with patient going to [facility] if they have a bed. CSW spoke to [place] at [facility] who reported they have a bed for patient today if Josem Kaufmann is obtained today.    Expected Discharge Plan: [place] Barriers to Discharge: Continued Medical Work up  Expected Discharge Plan and Services Expected Discharge Plan: [place] arrangements for the past 2 months: Single Family Home                                       Social Determinants of Health (SDOH) Interventions    Readmission Risk Interventions No flowsheet data found.

## 2020-05-24 ENCOUNTER — Encounter: Payer: Medicare PPO | Admitting: Dermatology

## 2020-09-11 ENCOUNTER — Ambulatory Visit: Payer: Medicare PPO | Admitting: Dermatology

## 2020-09-11 ENCOUNTER — Other Ambulatory Visit: Payer: Self-pay

## 2020-09-11 ENCOUNTER — Encounter: Payer: Self-pay | Admitting: Dermatology

## 2020-09-11 DIAGNOSIS — Z85828 Personal history of other malignant neoplasm of skin: Secondary | ICD-10-CM | POA: Diagnosis not present

## 2020-09-11 DIAGNOSIS — Z1283 Encounter for screening for malignant neoplasm of skin: Secondary | ICD-10-CM | POA: Diagnosis not present

## 2020-09-11 DIAGNOSIS — L570 Actinic keratosis: Secondary | ICD-10-CM | POA: Diagnosis not present

## 2020-09-11 DIAGNOSIS — D1800 Hemangioma unspecified site: Secondary | ICD-10-CM

## 2020-09-11 DIAGNOSIS — D229 Melanocytic nevi, unspecified: Secondary | ICD-10-CM

## 2020-09-11 DIAGNOSIS — S80261A Insect bite (nonvenomous), right knee, initial encounter: Secondary | ICD-10-CM

## 2020-09-11 DIAGNOSIS — L659 Nonscarring hair loss, unspecified: Secondary | ICD-10-CM | POA: Diagnosis not present

## 2020-09-11 DIAGNOSIS — W57XXXA Bitten or stung by nonvenomous insect and other nonvenomous arthropods, initial encounter: Secondary | ICD-10-CM

## 2020-09-11 DIAGNOSIS — L820 Inflamed seborrheic keratosis: Secondary | ICD-10-CM | POA: Diagnosis not present

## 2020-09-11 DIAGNOSIS — L578 Other skin changes due to chronic exposure to nonionizing radiation: Secondary | ICD-10-CM

## 2020-09-11 DIAGNOSIS — L821 Other seborrheic keratosis: Secondary | ICD-10-CM

## 2020-09-11 DIAGNOSIS — L814 Other melanin hyperpigmentation: Secondary | ICD-10-CM

## 2020-09-11 NOTE — Progress Notes (Signed)
Follow-Up Visit   Subjective  Janice Riley is a 80 y.o. female who presents for the following: TSBE (Total body exam today. 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 Pt states that she has some spots on her face that she has noticed. Pt reports a spot on her right leg that has been itching. ).  The following portions of the chart were reviewed this encounter and updated as appropriate:  Tobacco  Allergies  Meds  Problems  Med Hx  Surg Hx  Fam Hx      Review of Systems: No other skin or systemic complaints except as noted in HPI or Assessment and Plan.   Objective  Well appearing patient in no apparent distress; mood and affect are within normal limits.  A full examination was performed including scalp, head, eyes, ears, nose, lips, neck, chest, axillae, abdomen, back, buttocks, bilateral upper extremities, bilateral lower extremities, hands, feet, fingers, toes, fingernails, and toenails. All findings within normal limits unless otherwise noted below.  Objective  right forehead x 2, right cheek x 4, left temple x 1 (6): Erythematous thin papules/macules with gritty scale.   Objective  Scalp: Diffuse thinning, widen part, negative hair pull.   Objective  right medial calf x 1: Scaly pink plaque  Objective  right medial knee: Tick attached  Assessment & Plan  AK (actinic keratosis) (6) right forehead x 2, right cheek x 4, left temple x 1  Pt defers treatment today. Will plan to treat with LN2 at f/u in 1 month.  Alopecia Scalp  Negative hair pull test. C/w androgenetic alopecia, Chronic condition.  Recommend minoxidil 5% (Rogaine for men) solution or foam to be applied to the scalp and left in. This should ideally be used twice daily for best results but it helps with hair regrowth when used at least three times per week. Rogaine initially can cause increased hair shedding for the first few weeks but this will stop with continued use. In studies, people who used minoxidil  (Rogaine) for at least 6 months had thicker hair than people who did not. Minoxidil topical (Rogaine) only works as long as it continues to be used. If if it is no longer used then the hair it has been helping to regrow can fall out. Minoxidil topical (Rogaine) can cause increased facial hair growth which can usually be managed easily with a battery-operated hair trimmer. If facial hair growth is bothersome, switching to the 2% women's version can decrease the risk of unwanted facial hair growth.   Inflamed seborrheic keratosis right medial calf x 1  Ln2 today. Consider biopsy on return if indicated.   Prior to procedure, discussed risks of blister formation, small wound, skin dyspigmentation, or rare scar following cryotherapy.    Destruction of lesion - right medial calf x 1  Destruction method: cryotherapy   Informed consent: discussed and consent obtained   Lesion destroyed using liquid nitrogen: Yes   Outcome: patient tolerated procedure well with no complications   Post-procedure details: wound care instructions given    Tick bite of right knee, initial encounter right medial knee  Tick and surrounding site cleansed with alcohol and removed gently with forceps. Not engorged. Patient reports there less than 24 hours.  Lentigines - Scattered tan macules - Due to sun exposure - Benign-appering, observe - Recommend daily broad spectrum sunscreen SPF 30+ to sun-exposed areas, reapply every 2 hours as needed. - Call for any changes  Seborrheic Keratoses - Stuck-on, waxy, tan-brown papules and/or plaques  -  Benign-appearing - Discussed benign etiology and prognosis. - Observe - Call for any changes  Melanocytic Nevi - Tan-brown and/or pink-flesh-colored symmetric macules and papules - Benign appearing on exam today - Observation - Call clinic for new or changing moles - Recommend daily use of broad spectrum spf 30+ sunscreen to sun-exposed areas.   Hemangiomas - Red  papules - Discussed benign nature - Observe - Call for any changes  Actinic Damage - Chronic condition, secondary to cumulative UV/sun exposure - diffuse scaly erythematous macules with underlying dyspigmentation - Recommend daily broad spectrum sunscreen SPF 30+ to sun-exposed areas, reapply every 2 hours as needed.  - Staying in the shade or wearing long sleeves, sun glasses (UVA+UVB protection) and wide brim hats (4-inch brim around the entire circumference of the hat) are also recommended for sun protection.  - Call for new or changing lesions.  Skin cancer screening performed today.  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  - Call if any new or changing lesions are noted between office visits  Return in about 1 month (around 10/11/2020) for 1-2 months AK/treat ISK, 6 mo TBSE.   I, Harriett Sine, CMA, am acting as scribe for [name], MD.  Documentation: I have reviewed the above documentation for accuracy and completeness, and I agree with the above.  [name], MD

## 2020-09-11 NOTE — Patient Instructions (Addendum)
Recommend daily broad spectrum sunscreen SPF 30+ to sun-exposed areas, reapply every 2 hours as needed. Call for new or changing lesions.  Staying in the shade or wearing long sleeves, sun glasses (UVA+UVB protection) and wide brim hats (4-inch brim around the entire circumference of the hat) are also recommended for sun protection.    Recommend taking Heliocare sun protection supplement daily in sunny weather for additional sun protection. For maximum protection on the sunniest days, you can take up to 2 capsules of regular Heliocare OR take 1 capsule of Heliocare Ultra. For prolonged exposure (such as a full day in the sun), you can repeat your dose of the supplement 4 hours after your first dose. Heliocare can be purchased at [facility] or at [email or web address].     Gentle Skin Care Guide  1. Bathe no more than once a day.  2. Avoid bathing in hot water  3. Use a mild soap like Dove, Vanicream, Cetaphil, CeraVe. Can use Lever 2000 or Cetaphil antibacterial soap  4. Use soap only where you need it. On most days, use it under your arms, between your legs, and on your feet. Let the water rinse other areas unless visibly dirty.  5. When you get out of the bath/shower, use a towel to gently blot your skin dry, don't rub it.  6. While your skin is still a little damp, apply a moisturizing cream such as Vanicream, CeraVe, Cetaphil, Eucerin, Sarna lotion or plain Vaseline Jelly. For hands apply Neutrogena [place] Hand Cream or Excipial Hand Cream.  7. Reapply moisturizer any time you start to itch or feel dry.  8. Sometimes using free and clear laundry detergents can be helpful. Fabric softener sheets should be avoided. Downy Free & Gentle liquid, or any liquid fabric softener that is free of dyes and perfumes, it acceptable to use  9. If your doctor has given you prescription creams you may apply moisturizers over them      If you have any questions or concerns for your doctor,  please call our main line at [phone] and press option 4 to reach your doctor's medical assistant. If no one answers, please leave a voicemail as directed and we will return your call as soon as possible. Messages left after 4 pm will be answered the following business day.   You may also send [place] a message via [place]. We typically respond to MyChart messages within 1-2 business days.  For prescription refills, please ask your pharmacy to contact our office. Our fax number is [phone].  If you have an urgent issue when the clinic is closed that cannot wait until the next business day, you can page your doctor at the number below.    Please note that while we do our best to be available for urgent issues outside of office hours, we are not available 24/7.   If you have an urgent issue and are unable to reach [place], you may choose to seek medical care at your doctor's office, retail clinic, urgent care center, or emergency room.  If you have a medical emergency, please immediately call 911 or go to the emergency department.  Pager Numbers  - Dr. Nehemiah Massed: [phone]  - Dr. Laurence Ferrari: [phone]  - Dr. Nicole Kindred: [phone]  In the event of inclement weather, please call our main line at [phone] for an update on the status of any delays or closures.  Dermatology Medication Tips: Please keep the boxes that topical medications come in in order to  help keep track of the instructions about where and how to use these. Pharmacies typically print the medication instructions only on the boxes and not directly on the medication tubes.   If your medication is too expensive, please contact our office at [phone] option 4 or send [place] a message through [place].   We are unable to tell what your co-pay for medications will be in advance as this is different depending on your insurance coverage. However, we may be able to find a substitute medication at lower cost or fill out paperwork to get  insurance to cover a needed medication.   If a prior authorization is required to get your medication covered by your insurance company, please allow [place] 1-2 business days to complete this process.  Drug prices often vary depending on where the prescription is filled and some pharmacies may offer cheaper prices.  The website www.goodrx.com contains coupons for medications through different pharmacies. The prices here do not account for what the cost may be with help from insurance (it may be cheaper with your insurance), but the website can give you the [place] if you did not use any insurance.  - You can print the associated coupon and take it with your prescription to the pharmacy.  - You may also stop by our office during regular business hours and pick up a GoodRx coupon card.  - If you need your prescription sent electronically to a different pharmacy, notify our office through [facility] or by phone at [phone] option 4.

## 2021-01-04 ENCOUNTER — Other Ambulatory Visit: Payer: Self-pay | Admitting: Internal Medicine

## 2021-01-04 DIAGNOSIS — Z1231 Encounter for screening mammogram for malignant neoplasm of breast: Secondary | ICD-10-CM

## 2021-01-25 ENCOUNTER — Other Ambulatory Visit: Payer: Self-pay

## 2021-01-25 ENCOUNTER — Ambulatory Visit
Admission: RE | Admit: 2021-01-25 | Discharge: 2021-01-25 | Disposition: A | Discharge status: Home / Self Care | Payer: Medicare PPO | Source: Ambulatory Visit | Attending: Internal Medicine | Admitting: Internal Medicine

## 2021-01-25 DIAGNOSIS — Z1231 Encounter for screening mammogram for malignant neoplasm of breast: Secondary | ICD-10-CM | POA: Insufficient documentation

## 2021-03-14 ENCOUNTER — Encounter: Payer: Medicare PPO | Admitting: Dermatology

## 2021-03-20 ENCOUNTER — Ambulatory Visit: Payer: Self-pay

## 2021-10-31 IMAGING — CT CT HEAD W/O CM
3 series · 16 of 47 positions shown, 19 images · non-contrast
Comparison: None.

CLINICAL DATA: Fell.  Back surgery yesterday.

EXAM:
CT HEAD WITHOUT CONTRAST
TECHNIQUE: Contiguous axial images were obtained from the base of the skull
through the vertex without intravenous contrast.

[Series 3: head wo · axial · 0.44mm/px · z∈[-165,-40]mm · 10 of 31 slices shown, 13 images]
[im 3/31  brain]
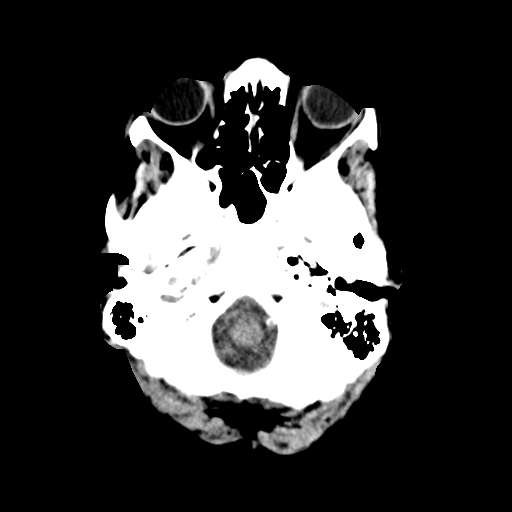
[im 3/31  bone]
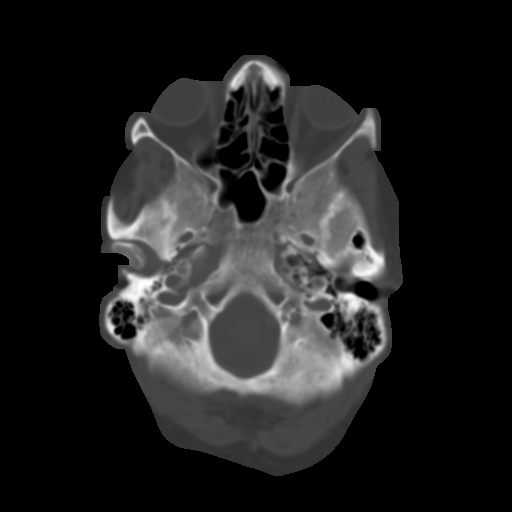
[im 6/31  brain]
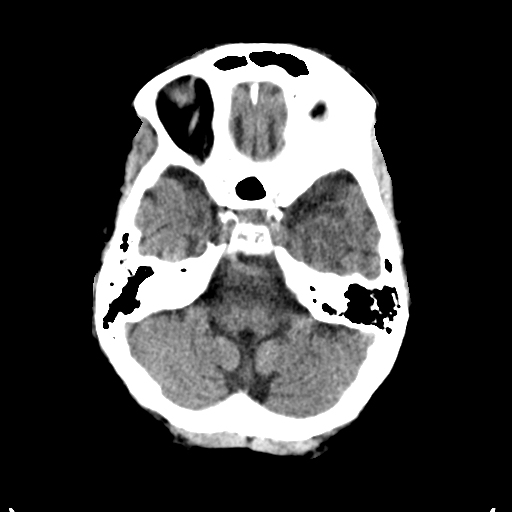
[im 9/31  brain]
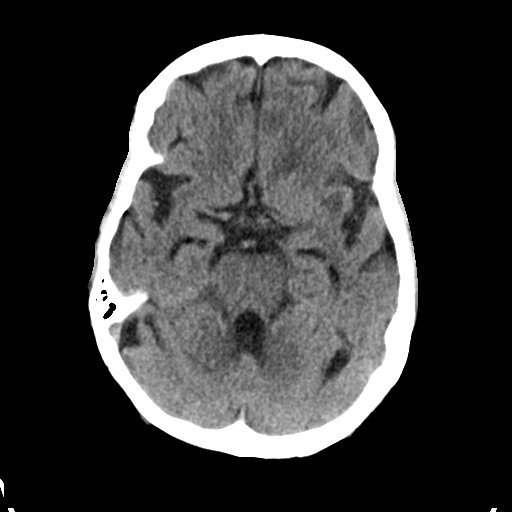
[im 11/31  brain]
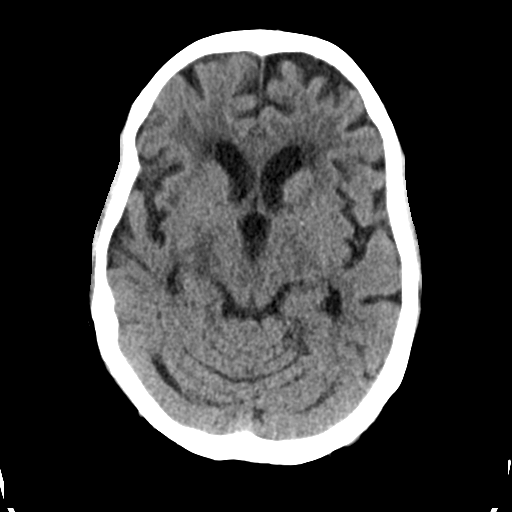
[im 14/31  brain]
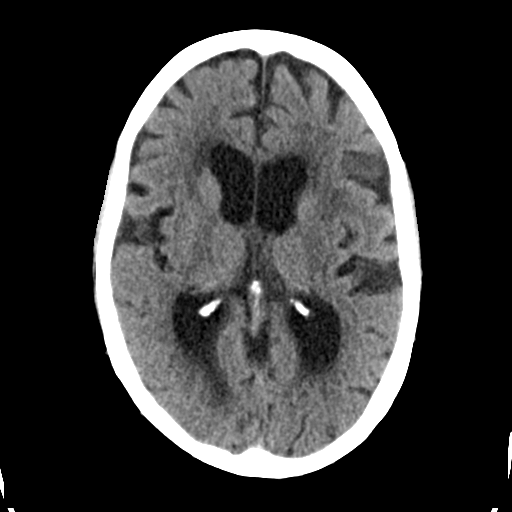
[im 14/31  bone]
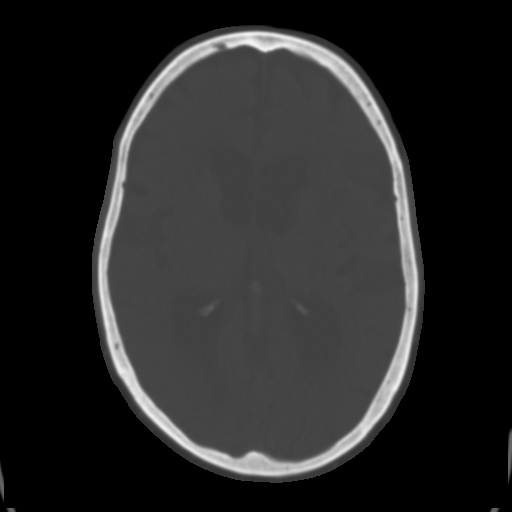
[im 17/31  brain]
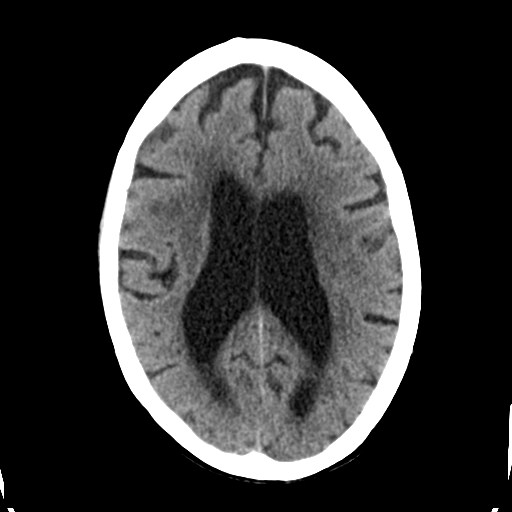
[im 20/31  brain]
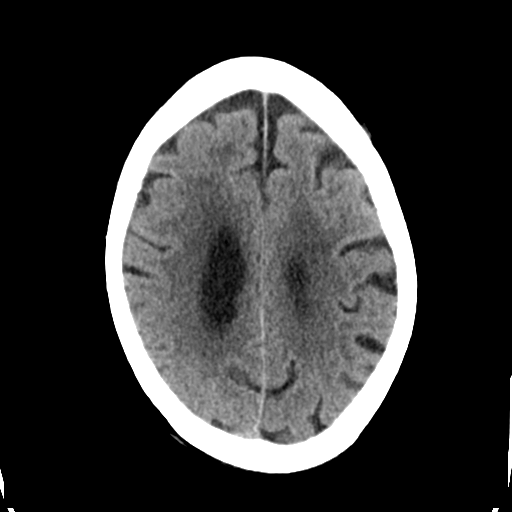
[im 23/31  brain]
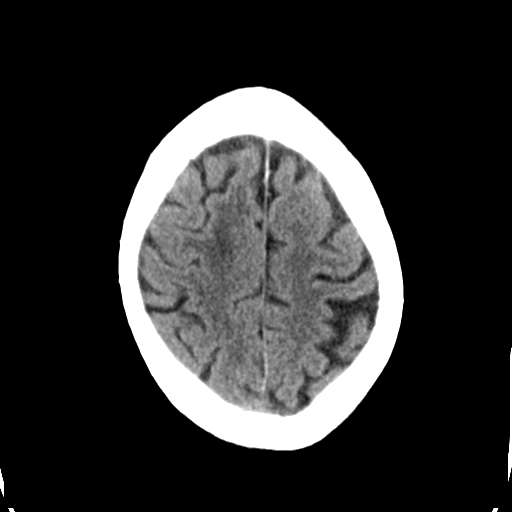
[im 25/31  brain]
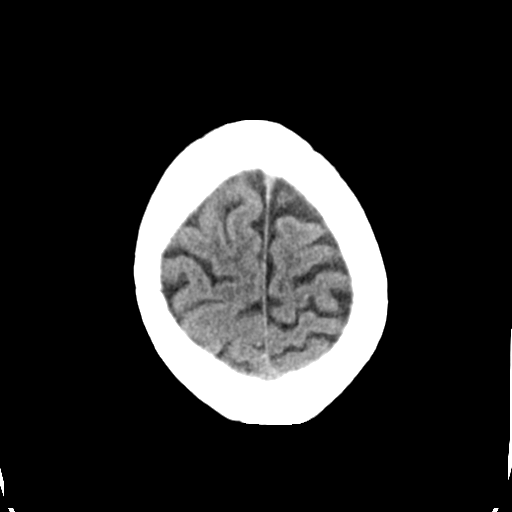
[im 25/31  bone]
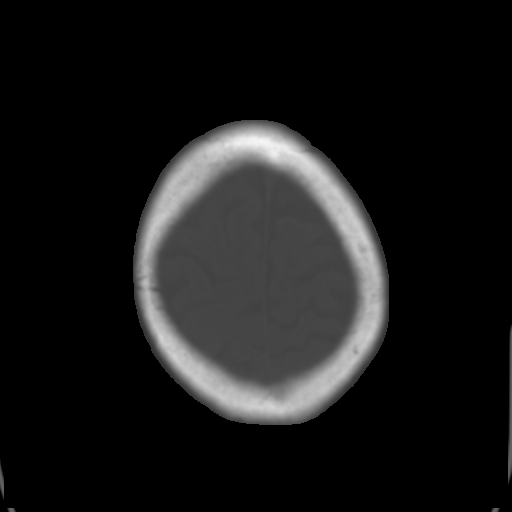
[im 28/31  brain]
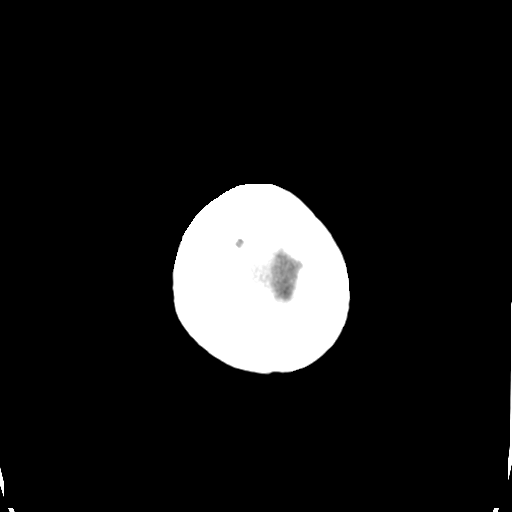

[Series 4: coronal soft tissue · coronal · 0.33mm/px · 3 of 66 slices shown]
[im 22/66  brain]
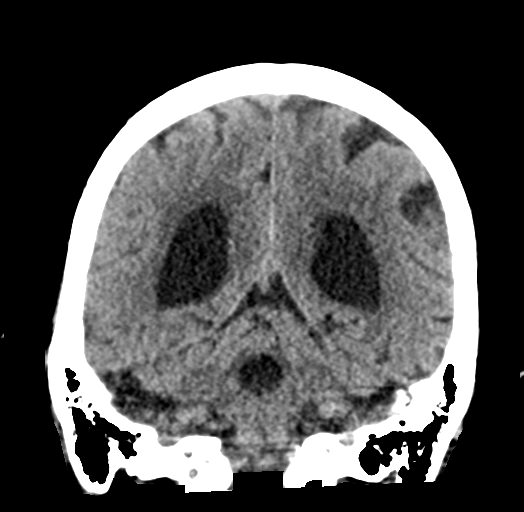
[im 29/66  brain]
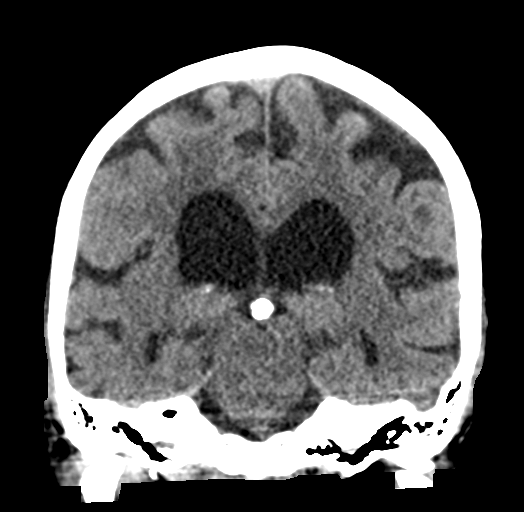
[im 37/66  brain]
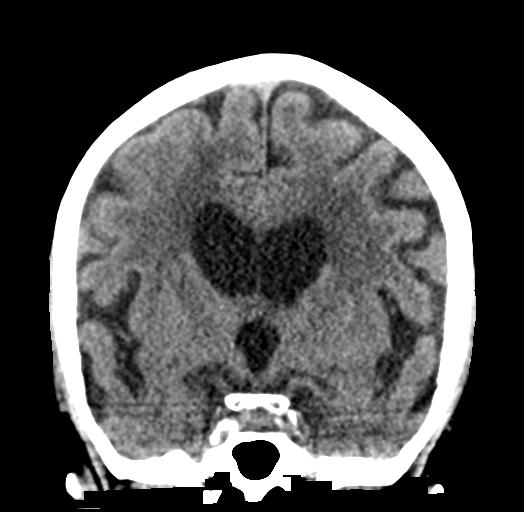

[Series 5: sagittal soft tissue · sagittal · 0.33mm/px · 3 of 53 slices shown]
[im 18/53  brain]
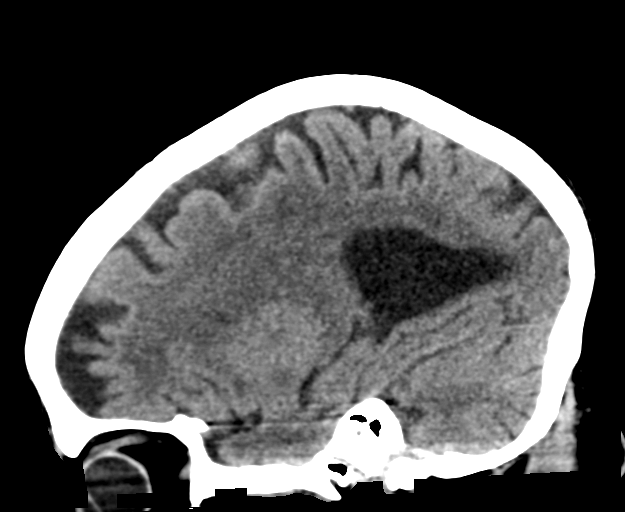
[im 27/53  brain]
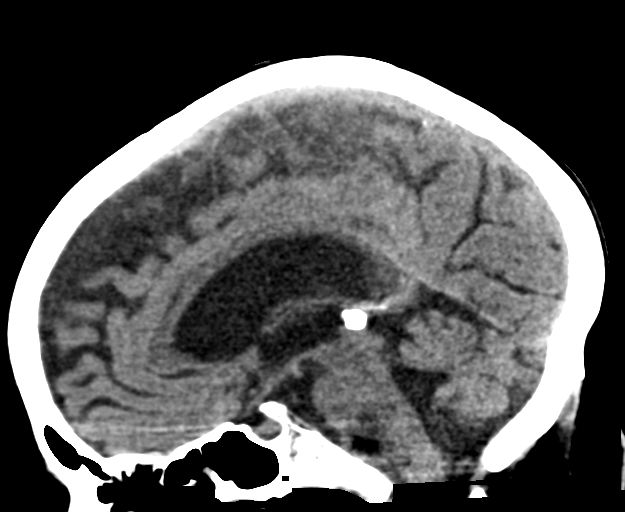
[im 35/53  brain]
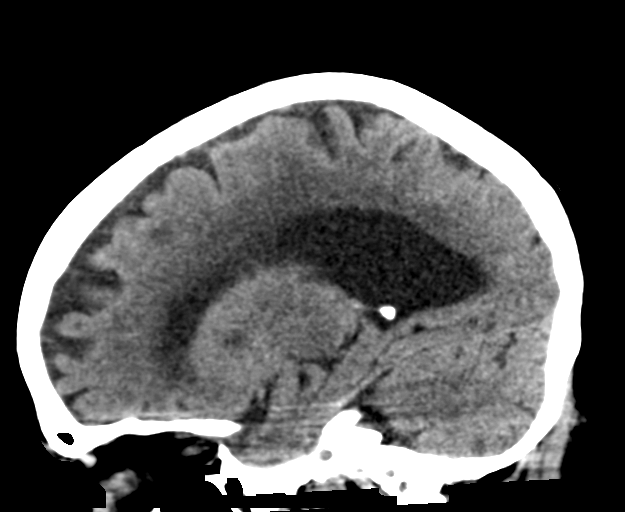

[16 of 47 positions shown; findings below may reference images not displayed]

FINDINGS: Brain: Age related atrophy. Chronic small-vessel changes of the
cerebral hemispheric white matter. No sign of acute infarction, mass
lesion, hemorrhage, hydrocephalus or extra-axial collection.
Ventricular size is in proportion to the degree of atrophy.

Vascular: There is atherosclerotic calcification of the major
vessels at the base of the brain.

Skull: Negative

Sinuses/Orbits: Clear/normal

Other: None
IMPRESSION: No acute or traumatic finding. Age related atrophy. Chronic
small-vessel changes of the cerebral hemispheric white matter.

## 2021-10-31 IMAGING — DX DG CHEST 1V PORT
2 series · 2 of 2 positions shown · non-contrast
Comparison: None

CLINICAL DATA: Weakness

EXAM:
PORTABLE CHEST 1 VIEW

[chest ap (1 of 2)]
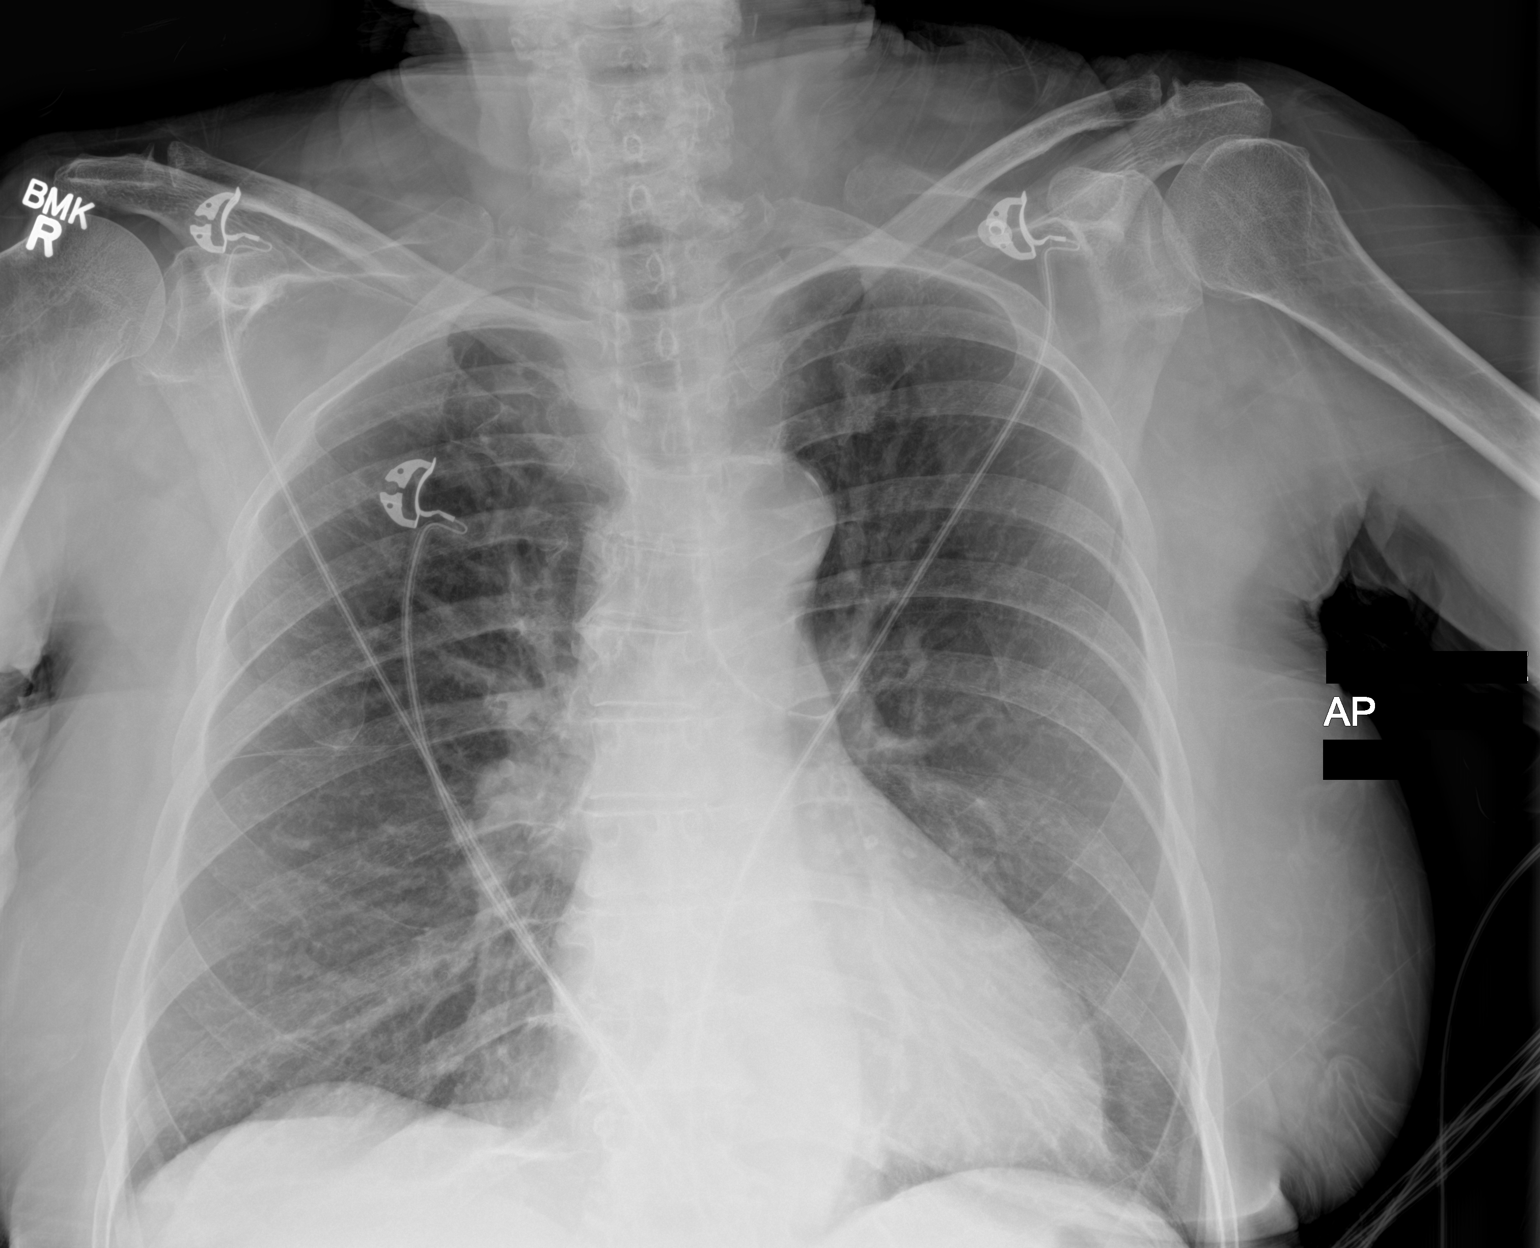

[chest ap (2 of 2)]
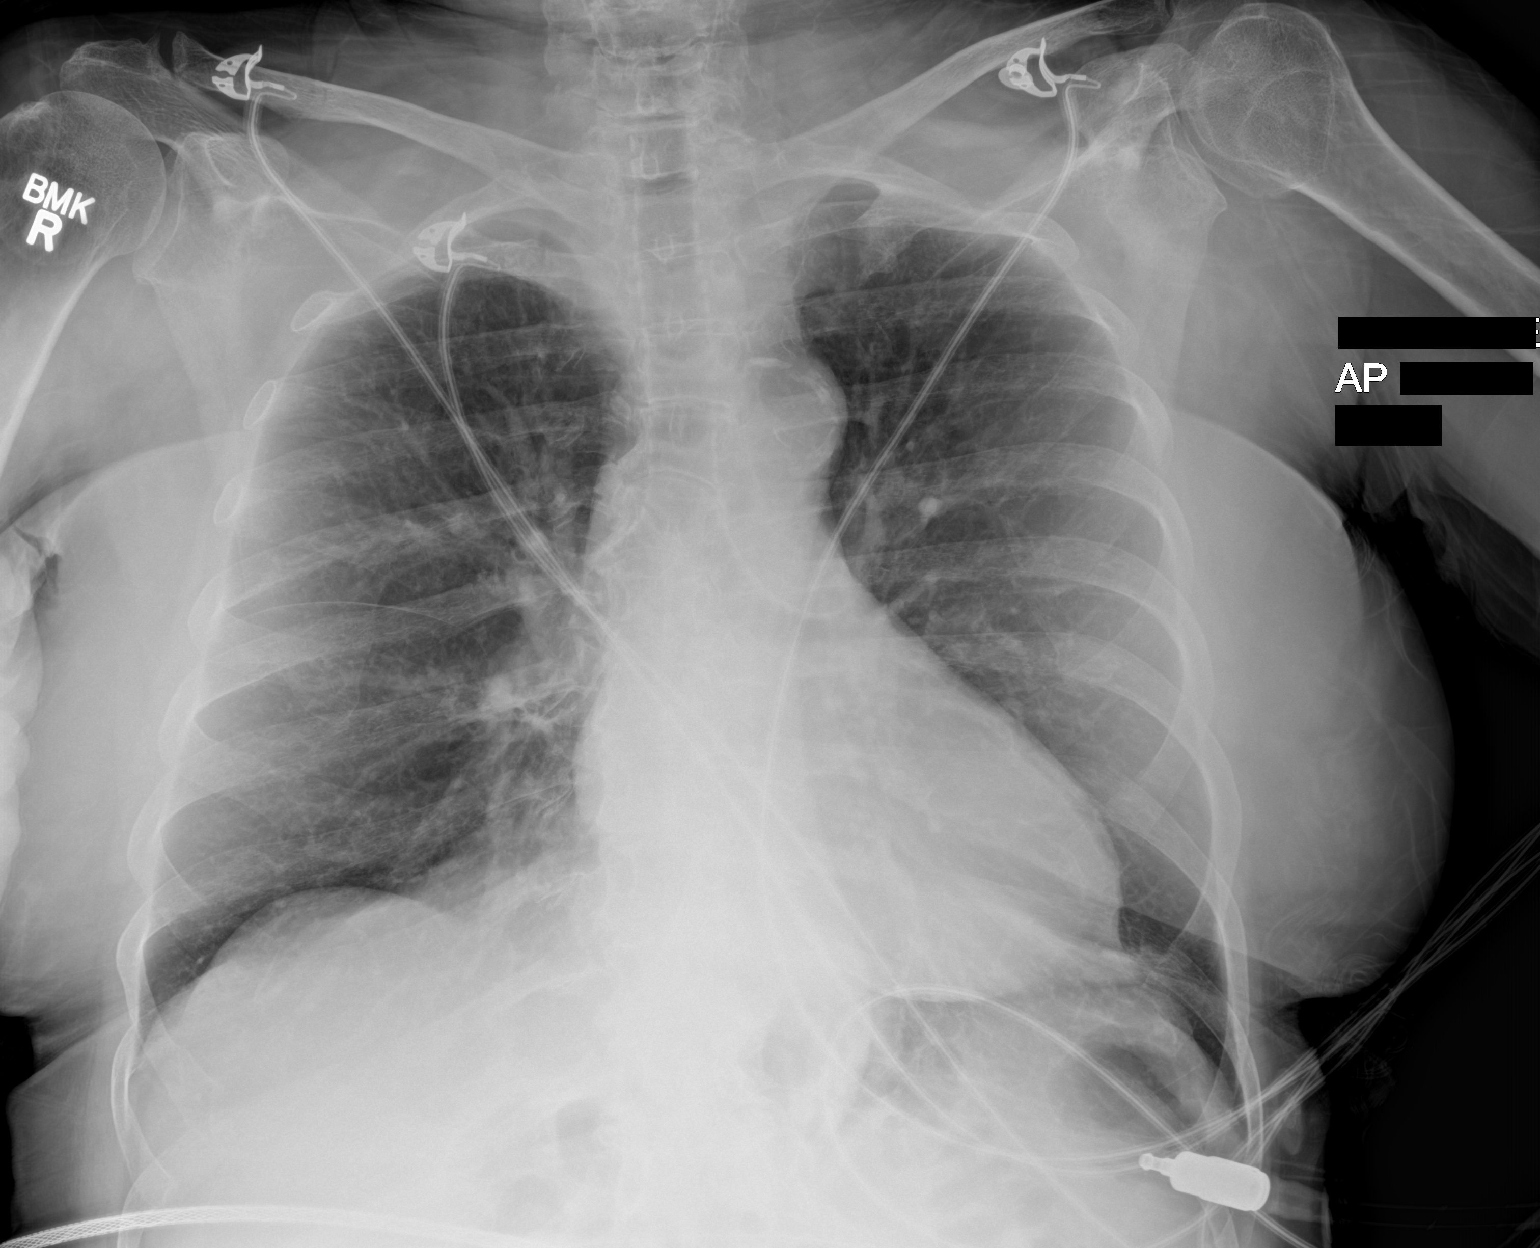

[2 of 2 positions shown; findings below may reference images not displayed]

FINDINGS: No focal opacity or pleural effusion. Normal cardiomediastinal
silhouette with aortic atherosclerosis. No pneumothorax. Streaky
atelectasis left lung base.
IMPRESSION: No active disease. Streaky atelectasis left base.

## 2021-12-14 ENCOUNTER — Other Ambulatory Visit: Payer: Self-pay | Admitting: General Surgery

## 2021-12-14 DIAGNOSIS — D0511 Intraductal carcinoma in situ of right breast: Secondary | ICD-10-CM

## 2022-02-04 ENCOUNTER — Ambulatory Visit
Admission: RE | Admit: 2022-02-04 | Discharge: 2022-02-04 | Disposition: A | Discharge status: Home / Self Care | Payer: Medicare PPO | Source: Ambulatory Visit | Attending: General Surgery | Admitting: General Surgery

## 2022-02-04 DIAGNOSIS — Z1231 Encounter for screening mammogram for malignant neoplasm of breast: Secondary | ICD-10-CM | POA: Insufficient documentation

## 2022-02-04 DIAGNOSIS — D0511 Intraductal carcinoma in situ of right breast: Secondary | ICD-10-CM | POA: Diagnosis present

## 2022-03-28 ENCOUNTER — Other Ambulatory Visit: Payer: Self-pay | Admitting: Internal Medicine

## 2022-03-28 DIAGNOSIS — R42 Dizziness and giddiness: Secondary | ICD-10-CM

## 2022-03-31 ENCOUNTER — Ambulatory Visit
Admission: RE | Admit: 2022-03-31 | Discharge: 2022-03-31 | Disposition: A | Discharge status: Home / Self Care | Payer: Medicare PPO | Source: Ambulatory Visit | Attending: Internal Medicine | Admitting: Internal Medicine

## 2022-03-31 DIAGNOSIS — R42 Dizziness and giddiness: Secondary | ICD-10-CM | POA: Insufficient documentation

## 2022-04-09 ENCOUNTER — Ambulatory Visit: Payer: Self-pay

## 2023-09-03 ENCOUNTER — Other Ambulatory Visit: Payer: Self-pay | Admitting: Physician Assistant

## 2023-09-03 DIAGNOSIS — R270 Ataxia, unspecified: Secondary | ICD-10-CM

## 2023-09-03 DIAGNOSIS — R42 Dizziness and giddiness: Secondary | ICD-10-CM

## 2023-09-06 ENCOUNTER — Ambulatory Visit
Admission: RE | Admit: 2023-09-06 | Discharge: 2023-09-06 | Disposition: A | Discharge status: Home / Self Care | Source: Ambulatory Visit | Attending: Physician Assistant | Admitting: Physician Assistant

## 2023-09-06 DIAGNOSIS — R42 Dizziness and giddiness: Secondary | ICD-10-CM | POA: Insufficient documentation

## 2023-09-06 DIAGNOSIS — R270 Ataxia, unspecified: Secondary | ICD-10-CM | POA: Insufficient documentation

## 2023-09-06 MED ORDER — GADOBUTROL 1 MMOL/ML IV SOLN
6.0000 mL | Freq: Once | INTRAVENOUS | Status: AC | PRN
  Administered 2023-09-06: 6 mL via INTRAVENOUS

## 2023-12-22 ENCOUNTER — Ambulatory Visit: Attending: Neurology

## 2023-12-22 DIAGNOSIS — R262 Difficulty in walking, not elsewhere classified: Secondary | ICD-10-CM | POA: Diagnosis present

## 2023-12-22 DIAGNOSIS — R2681 Unsteadiness on feet: Secondary | ICD-10-CM | POA: Insufficient documentation

## 2023-12-22 NOTE — Therapy (Signed)
 OUTPATIENT PHYSICAL THERAPY EVALUATION   Patient Name: Janice Riley MRN: [id number] DOB:10/29/1940, 83 y.o., female Today's Date: 12/22/2023  PCP: Fernande Ophelia JINNY DOUGLAS, MD REFERRING PROVIDER: Maree Jannett POUR, Md [address] [facility] [place],  [place] [address]   END OF SESSION:  PT End of Session - 12/22/23 1114     Visit Number 1    Number of Visits 12    Date for PT Re-Evaluation 02/02/24    Authorization Type [employer]    Authorization Time Period 12/22/23-02/02/24    Progress Note Due on Visit 10    PT Start Time 1107    PT Stop Time 1145    PT Time Calculation (min) 38 min    Equipment Utilized During Treatment Gait belt    Activity Tolerance Patient tolerated treatment well;No increased pain    Behavior During Therapy WFL for tasks assessed/performed          Past Medical History:  Diagnosis Date   Arthritis    FINGERS   Breast cancer, stage 0, 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 ER/PR positive. Mammosite RX.    Dysrhythmia 05/2017   complete workup by Dr. Hester.  all was good   Hypothyroidism    Personal history of radiation therapy 2019   Right breast - mammotone rx   Squamous cell carcinoma of skin 02/08/2020   left pretibia/EDC   Thyroid disease    Past Surgical History:  Procedure Laterality Date   BACK SURGERY     BREAST BIOPSY Right 01/26/2017   Affirm Bx- DUCTAL CARCINOMA IN SITU HIGH-GRADE COMEDO TYPE WITH ASSOCIATED    BREAST LUMPECTOMY Right 06/2017   DCIS   BREAST RECONSTRUCTION WITH MASTOPLASTY Right 07/06/2017   Procedure: BREAST RECONSTRUCTION WITH MASTOPLASTY;  Surgeon: Dessa Reyes ORN, MD;  Location: [facility] ORS;  Service: General;  Laterality: Right;   COLONOSCOPY  2016   Dr Ora   EYE SURGERY Bilateral 2017   LAMINECTOMY N/A 04/11/2020   Procedure: LEFT L4-5 SYNOVIAL CYST RESECTION;  Surgeon: Clois Fret, MD;  Location: [facility] ORS;  Service: Neurosurgery;  Laterality: N/A;    MASTECTOMY, PARTIAL Right 07/06/2017   Procedure: MASTECTOMY PARTIAL;  Surgeon: Dessa Reyes ORN, MD;  Location: [facility] ORS;  Service: General;  Laterality: Right;   Patient Active Problem List   Diagnosis Date Noted   Acute back pain 04/13/2020   Postoperative pain after spinal surgery    Acute metabolic encephalopathy    Back pain 04/12/2020   Osteoporosis 02/04/2018   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   PERTINENT HISTORY:  83yoF referred by neurology for sensorimotor peripheral neuropathy with ongoing unsteadiness, wide based gait ataxia. Pt reports no major falls by time of evaluation here, however she is concerned about sustaining a fall. Pt has been reluctant to follow recommendations from neurology for [facility] or RW use, although her husband bought her a rollator and she has used this on occasion at home. Pt is a retired Radio producer.   PAIN:  Are you having pain? No  PRECAUTIONS: Fall  WEIGHT BEARING RESTRICTIONS: No   FALLS: Has patient fallen in last 6 months? No   LIVING ENVIRONMENT: Lives with: Husband  Has following equipment at home: Rollator   PLOF: Fully independent, although restricts the amount of community distance AMB done due to high effort burden.   PATIENT GOALS: remain active, avoid a fall.   OBJECTIVE:  Note: Objective measures were completed at evaluation unless otherwise noted.  DIAGNOSTIC  FINDINGS:   COGNITION: Overall cognitive status: Pt has some ST memory difficulty with sequencing, noted during a card-play activity, sorting by suit and playing memory with 8 cards (4 matched pairs). Pt endorses some memory difficulty with being given HEP assignments.    SENSATION: Defer formal assessment to neurology who has evaluated this extensively.   POSTURE: intermittent crouched posturing as she uses a trunk/knee strategy for postural righting in stance and gait.   TRANSFERS: Altered motor planning to use new righting mechnaicsms in knees/trunk, maintains flexed knees/trunk, heavy knee valgus bilaterally. Good safety awareness and risk aversion.   FUNCTIONAL TESTS:  5xSTS: 12.44sec, 9.22sec (falls anxiety with max speed)   : 9.73sec, 1.84m/s   backwards: 59sec, requires minGuard assist due to falls anxiety, 1 LOB (0.88m/s)   Sustained AMB c 4WW: 980ft in 87m35s (some difficulty with obstacle avoidance)  (0.72m/s)   PATIENT SURVEYS:  Plan to issue ABC Scale on visit 2                                                                                                                              TREATMENT DATE 12/22/23 : Education on importance of use of SPC and/or RW due to severity of balance deficits, and persistent recommendations from neurologist 5xSTS: 12.44sec, 9.22sec (falls anxiety with max speed)  : 9.73sec, 1.37m/s  backwards: 59sec, requires minGuard assist due to falls anxiety, 1 LOB (0.38m/s)  Sustained AMB c 4WW: 934ft in 83m35s (some difficulty with obstacle avoidance)  (0.35m/s) 10xSTS from chair c feet on airex, min guard assist  Sustained stance on airex pad while pt sorts a deck of cards by suit: uses calves on chair for 3rd and 4th points of stability, needs cues to consolidate to 4 stacks of cards, rather than 6 stacks of cards  Sustained stance on airex pad playing memory card game with 8 cards, 4 matched pairs: pt shows significant  difficulty with maintaining card position after return  to face down.   PATIENT EDUCATION: Education details: Indication for use of DME to avoid falls, as well as to maintain active lifestyle and need to practice with each of these for optimal utility  Person educated: patient  Education method: collaborative learning, deliberate practice, positive reinforcement, explicit instruction, establish rules. Education comprehension: Fair, unknown if ST memory deficits will mean reinforcement needs   HOME EXERCISE PROGRAM: 12/22/23: asked pt to go into community 1x daily with either 4WW or shopping cart and AMB for 10 minutes sustained.    GOALS: Goals reviewed with patient? No   SHORT TERM GOALS: Target date: 01/22/24  Pt consistent with physical actiivty recommendations for practice using 4WW and/or SPC  Baseline: inconsistent use prior to start  Goal status: INITIAL  2.  Pt to demonstrate improved backward walking AEB reduced time by >20sec. Baseline: 59sec Goal status: INITIAL  3.  Pt to demonstrate tolerance of >2085ft with DME ad lib to demonstrate improved energy conservation and activity tolerance for community level AMB  Baseline:  Goal status: INITIAL  LONG TERM GOALS: Target date: 02/02/24  Pt to demonstrate <9.00sec without LOB to demonstrate improved self selected gait speed.  Baseline:  Goal status: INITIAL  2.  Pt to demonstrate improved dynamic trunk motor control AEB backward walking AEB reduced time by >30sec. Baseline: 59sec  Goal status: INITIAL  3.  Pt to report improved confidence in balance AEB ABC scale improvement >15%.  Baseline:  Goal status: INITIAL  4.  Pt to demonstrate improved confidence in balance and independence in step-righting recovery AEB SLS balance > 3 seconds at modI level and without use of hands for recovery of LOB.  Baseline:  Goal status: INITIAL  5. Pt to report successful integration of DME into IADL AEB ability to  partake in more activities without falls concerns and activities that requires more AMB and/or more time.   ASSESSMENT:  CLINICAL IMPRESSION: 83yoF referred to OPPT for progressing BLE neuropathy and imbalance. Pt educated on importance and role of use of DME for mobility. Began balance interventions this date to start setting some baseline for many limited activities, most of which pt avoids due to a generally good self awareness of limitations. Incorporated some cognitive dual tasking components during static balance training which are revealing of degree of mild cognitive impairment overall, hence pt may benefit from repeated encouragement as well as written education materials. Pt has had no falls overall despite very limited balance performance, has continued to AMB ad lib unsupported which actually has likely been a very potent stimulus in the area of adaptation and compensation to her current deficits. Patient will benefit from skilled physical therapy intervention to reduce deficits and impairments identified in evaluation, in order to reduce pain, improve quality of life, and maximize activity tolerance for ADL, IADL, and leisure/fitness. Physical therapy will help pt achieve long and short term goals of care.   OBJECTIVE IMPAIRMENTS: Decreased knowledge of condition, decreased use of DME, decreased mobility, difficulty walking, decreased strength, decreased ROM. ACTIVITY LIMITATIONS: Lifting, standing, walking, squatting, transfers, locomotion level PARTICIPATION LIMITATIONS: Cleaning, laundry, interpersonal relationships, driving, yardwork, community activity.  PERSONAL FACTORS: Age, behavior pattern, education, past/current experiences, transportation, profession  are also affecting patient's functional outcome.  REHAB POTENTIAL: Fair   CLINICAL DECISION MAKING: Unstable/unpredictable EVALUATION COMPLEXITY: Moderate  PLAN:  PT FREQUENCY: 1-2x/week PT DURATION: 6 weeks PLANNED  INTERVENTIONS: 97110-Therapeutic exercises, 97530- Therapeutic activity, [id number]- Neuromuscular re-education, 97535- Self Care, 02859- Manual therapy, [id number]- Gait  training, [phone]- Electrical stimulation (unattended), [phone]- Electrical stimulation (manual), Patient/Family education, Balance training, Stair training, Cognitive remediation, and DME instructions  PLAN FOR NEXT SESSION: FU on daily walking assignments; continue to advance 4WW interventions for community distance endurance walking and begin training [facility] for short distance technical walking;  ABCScale   Jerolyn Flenniken C, PT 12/22/2023, 5:14 PM   5:37 PM, 12/22/23 Peggye JAYSON Linear, PT, DPT Physical Therapist - [facility]  Outpatient Physical Therapy- Main Campus [phone]

## 2023-12-24 ENCOUNTER — Ambulatory Visit

## 2023-12-28 ENCOUNTER — Ambulatory Visit

## 2023-12-30 ENCOUNTER — Ambulatory Visit

## 2024-01-05 ENCOUNTER — Ambulatory Visit

## 2024-01-07 ENCOUNTER — Ambulatory Visit

## 2024-01-12 ENCOUNTER — Ambulatory Visit

## 2024-01-15 ENCOUNTER — Ambulatory Visit

## 2024-01-20 ENCOUNTER — Ambulatory Visit

## 2024-01-22 ENCOUNTER — Ambulatory Visit

## 2024-01-26 ENCOUNTER — Ambulatory Visit

## 2024-01-28 ENCOUNTER — Ambulatory Visit

## 2024-02-02 ENCOUNTER — Ambulatory Visit

## 2024-02-04 ENCOUNTER — Ambulatory Visit

## 2024-02-09 ENCOUNTER — Ambulatory Visit

## 2024-02-11 ENCOUNTER — Ambulatory Visit

## 2024-02-16 ENCOUNTER — Ambulatory Visit

## 2024-02-18 ENCOUNTER — Ambulatory Visit

## 2024-02-23 ENCOUNTER — Ambulatory Visit

## 2024-02-25 ENCOUNTER — Ambulatory Visit

## 2024-03-01 ENCOUNTER — Ambulatory Visit

## 2024-03-03 ENCOUNTER — Ambulatory Visit

## 2024-03-08 ENCOUNTER — Ambulatory Visit

## 2024-03-10 ENCOUNTER — Ambulatory Visit

## 2024-03-15 ENCOUNTER — Ambulatory Visit

## 2024-03-17 ENCOUNTER — Ambulatory Visit

## 2024-03-22 ENCOUNTER — Ambulatory Visit

## 2024-03-24 ENCOUNTER — Ambulatory Visit

## 2024-03-29 ENCOUNTER — Ambulatory Visit

## 2024-03-31 ENCOUNTER — Ambulatory Visit
